# Patient Record
Sex: Female | Born: 1960 | Race: White | Hispanic: No | Marital: Married | Smoking: Never smoker
Health system: Southern US, Community
[De-identification: ages and names within clinical notes are randomized; demographics above are authoritative.]

## PROBLEM LIST (undated history)

## (undated) DIAGNOSIS — E079 Disorder of thyroid, unspecified: Secondary | ICD-10-CM

## (undated) DIAGNOSIS — K429 Umbilical hernia without obstruction or gangrene: Secondary | ICD-10-CM

## (undated) DIAGNOSIS — R32 Unspecified urinary incontinence: Secondary | ICD-10-CM

## (undated) HISTORY — DX: Disorder of thyroid, unspecified: E07.9

## (undated) HISTORY — DX: Umbilical hernia without obstruction or gangrene: K42.9

## (undated) HISTORY — DX: Unspecified urinary incontinence: R32

## (undated) HISTORY — PX: ABDOMINAL HYSTERECTOMY: SHX81

---

## 2017-03-02 ENCOUNTER — Ambulatory Visit (INDEPENDENT_AMBULATORY_CARE_PROVIDER_SITE_OTHER): Payer: No Typology Code available for payment source | Admitting: Obstetrics & Gynecology

## 2017-03-02 ENCOUNTER — Encounter: Payer: Self-pay | Admitting: Obstetrics & Gynecology

## 2017-03-02 ENCOUNTER — Other Ambulatory Visit (HOSPITAL_COMMUNITY)
Admission: RE | Admit: 2017-03-02 | Discharge: 2017-03-02 | Disposition: A | Discharge status: Home / Self Care | Payer: No Typology Code available for payment source | Source: Ambulatory Visit | Attending: Obstetrics & Gynecology | Admitting: Obstetrics & Gynecology

## 2017-03-02 VITALS — BP 116/80 | HR 86 | Resp 16 | Ht 63.5 in | Wt 152.0 lb

## 2017-03-02 DIAGNOSIS — N813 Complete uterovaginal prolapse: Secondary | ICD-10-CM | POA: Diagnosis not present

## 2017-03-02 DIAGNOSIS — Z01419 Encounter for gynecological examination (general) (routine) without abnormal findings: Secondary | ICD-10-CM

## 2017-03-02 DIAGNOSIS — Z124 Encounter for screening for malignant neoplasm of cervix: Secondary | ICD-10-CM | POA: Insufficient documentation

## 2017-03-02 NOTE — Patient Instructions (Addendum)
The new shingles vaccine is called Shingrix.  Let me know if you decide you want to get this vaccine.    When you have your next blood work.done, make sure you have a Hepatitis C Antibody drawn.

## 2017-03-02 NOTE — Progress Notes (Addendum)
56 y.o. G3P3 Married Caucasian F here for annual exam/new patient appointment.  She has moved from [place].  Moved due to job for husband.  Has been menopausal about four years.  Seeing Dr. Sharol Roussel.  Had lab work done within the last year.  On armour thyroid.  Has history of prolapse.  Feels this has worsened in the past couple of years.  Pessary recommended in the past but has not used one at this point.  Denies vaginal bleeding.  Never on HRT.  Patient's last menstrual period was 01/21/2013.          Sexually active: Yes.    The current method of family planning is post menopausal status.    Exercising: No.  The patient does not participate in regular exercise at present. Smoker:  no  Health Maintenance: Pap:  04/2014 at [facility]  History of abnormal Pap:  no MMG:  04/2014 at [facility]  Colonoscopy:  06/2014 normal per patient (obtained records--had hyperplastic polyp.  07/13/14.  Dr. Watt Climes) BMD:   never TDaP:  >10 years  Pneumonia vaccine(s):  Never Zostavax:   never Hep C testing: never Screening Labs: done recently, Hb today: same   reports that she has never smoked. She has never used smokeless tobacco. She reports that she drinks alcohol. She reports that she does not use drugs.  Past Medical History:  Diagnosis Date  . Thyroid disease   . Urinary incontinence     History reviewed. No pertinent surgical history.  Current Outpatient Prescriptions  Medication Sig Dispense Refill  . amoxicillin (AMOXIL) 875 MG tablet TK 1 T PO Q 12 H FOR 10 DAYS  0  . ARMOUR THYROID PO Take 125 mg by mouth daily.    . Cholecalciferol (VITAMIN D3 PO) Take 10,000 Int'l Units by mouth 3 (three) times a week.     No current facility-administered medications for this visit.     Family History  Problem Relation Age of Onset  . Hypertension Mother   . Hypertension Father   . Heart disease Father   . Dementia Maternal Grandmother     ROS:  Pertinent items are noted in HPI.   Otherwise, a comprehensive ROS was negative.  Exam:   BP 116/80 (BP Location: Right Arm, Patient Position: Sitting, Cuff Size: Normal)   Pulse 86   Resp 16   Ht 5' 3.5" (1.613 m)   Wt 152 lb (68.9 kg)   LMP 01/21/2013   BMI 26.50 kg/m     Height: 5' 3.5" (161.3 cm)  Ht Readings from Last 3 Encounters:  03/02/17 5' 3.5" (1.613 m)    General appearance: alert, cooperative and appears stated age Head: Normocephalic, without obvious abnormality, atraumatic Neck: no adenopathy, supple, symmetrical, trachea midline and thyroid normal to inspection and palpation Lungs: clear to auscultation bilaterally Breasts: normal appearance, no masses or tenderness Heart: regular rate and rhythm Abdomen: soft, non-tender; bowel sounds normal; no masses,  no organomegaly Extremities: extremities normal, atraumatic, no cyanosis or edema Skin: Skin color, texture, turgor normal. No rashes or lesions Lymph nodes: Cervical, supraclavicular, and axillary nodes normal. No abnormal inguinal nodes palpated Neurologic: Grossly normal   Pelvic: External genitalia:  no lesions              Urethra:  normal appearing urethra with no masses, tenderness or lesions              Bartholins and Skenes: normal  Vagina: complete uterine procidentia noted with dryness of tissue to being chronically outside the body              Cervix: no lesions and but tissue is thickened and rough in appearance              Pap taken: Yes.   Bimanual Exam:  Uterus:  normal size, contour, position, consistency, mobility, non-tender              Adnexa: normal adnexa and no mass, fullness, tenderness               Rectovaginal: Confirms               Anus:  normal sphincter tone, no lesions  Chaperone was present for exam.  A:  Well Woman with normal exam Complete uterine procidentia Hypothyroidism Urinary incontinence  P:   Mammogram guidelines discussed.  Information for local mammograms given to pt  today pap smear and HR HPV obtained today D/w pt she really needs to consider having the procidentia managed as she is only 82 and has a significant amount of time of her life to live with this significant prolapse.  Pt in agreement so referral to Dr. Quincy Simmonds made for consideration of sacrocolpopexy.  May be able to do this robotically.  Pt aware may need urodynamics but will defer to Dr. Quincy Simmonds for this recommendation. Hep C antibody testing recommended.  Pt declined today and states she will do this with next lab work with Dr. Sharol Roussel. return annually or prn

## 2017-03-04 NOTE — Progress Notes (Signed)
GYNECOLOGY  VISIT   HPI: 56 y.o.   Married  Caucasian  female   G3P3 with Patient's last menstrual period was 01/21/2013.   here for surgery consult per Dr.Miller. History of urinary incontinence and complete uterine procidentia.   Prolapse for 4 years.  Started mild but is progressive for the last year.   Having urinary urgency and some leakage of urine.  Leakage with a cough or sneeze. Walking increases urgency.  No leakage for no reason at all.   Voiding every 15 minutes upon waking.  Then during the day, voids every 2 hours.  NF - once a night.  No enuresis.   Voiding well.  Drinks coffee in am. Tea 3 times per week. Wine here and there, not every night.  Rare constipation.  No regular stool softeners.  No splinting to have BMs.  No fecal incontinence.   Not as sexually active due to the prolapse.   3 vaginal deliveries.  Largest 8 pounds 9 ounces.  No operative delivery.  Post partum hemorrhage with first delivery.   No transfusion.  GYNECOLOGIC HISTORY: Patient's last menstrual period was 01/21/2013. Contraception:  Postmenopausal Menopausal hormone therapy:  Postmenopausal Last mammogram: 04/2014 at [facility] Last pap smear:03-02-17 WNL NEG HR HPV         OB History    Gravida Para Term Preterm AB Living   3 3           SAB TAB Ectopic Multiple Live Births                     Patient Active Problem List   Diagnosis Date Noted  . Uterine procidentia 03/05/2017    Past Medical History:  Diagnosis Date  . Thyroid disease   . Urinary incontinence     No past surgical history on file.  Current Outpatient Prescriptions  Medication Sig Dispense Refill  . amoxicillin (AMOXIL) 875 MG tablet TK 1 T PO Q 12 H FOR 10 DAYS  0  . ARMOUR THYROID PO Take 125 mg by mouth daily.    . Cholecalciferol (VITAMIN D3 PO) Take 10,000 Int'l Units by mouth 3 (three) times a week.     No current facility-administered medications for this visit.       ALLERGIES: Codeine  Family History  Problem Relation Age of Onset  . Hypertension Mother   . Hypertension Father   . Heart disease Father   . Dementia Maternal Grandmother     Social History   Social History  . Marital status: Married    Spouse name: N/A  . Number of children: N/A  . Years of education: N/A   Occupational History  . Not on file.   Social History Main Topics  . Smoking status: Never Smoker  . Smokeless tobacco: Never Used  . Alcohol use Yes     Comment: occasionally   . Drug use: No  . Sexual activity: Yes    Birth control/ protection: Post-menopausal   Other Topics Concern  . Not on file   Social History Narrative  . No narrative on file    ROS:  Pertinent items are noted in HPI.  PHYSICAL EXAMINATION:    BP 104/74 (BP Location: Right Arm, Patient Position: Sitting, Cuff Size: Normal)   Pulse 60 Comment: irregular  Resp 16   Ht 5' 3.5" (1.613 m)   Wt 153 lb 9.6 oz (69.7 kg)   LMP 01/21/2013   BMI 26.78 kg/m  General appearance: alert, cooperative and appears stated age   Abdomen:  Soft, nontender, 3 cm umbilical hernia noted, no organomegaly.   Pelvic: External genitalia:  no lesions              Urethra:  normal appearing urethra with no masses, tenderness or lesions              Bartholins and Skenes: normal                 Vagina: normal appearing vagina with normal color and discharge, no lesions.  Procidentia noted.              Cervix: no lesions                Bimanual Exam:  Uterus:  normal size, contour, position, consistency, mobility, non-tender              Adnexa: no mass, fullness, tenderness              Rectal exam: Yes.   Confirms.              Anus:  normal sphincter tone, no lesions  Chaperone was present for exam.  ASSESSMENT  Uterine procidentia.  Urinary urgency and frequency.  Stress incontinence.  Umbilical hernia.   Overdue for mammogram.   PLAN  I have had a comprehensive discussion with the  patient regarding prolapse and urinary incontinence and a discussion of pelvic anatomy using a 3D model.    I have provided reading materials from [employer] regarding prolapse and incontinence in general as well as medical and surgical treatment for these conditions.   We talked about observational management also.  Medical treatments may include physical therapy, pessary use.  We discussed surgical care options for hysterectomy combined with a sacrocolpopexy (abdominally or robotically), anterior and posterior colporrhaphy, TVT Exact midurethral sling and cystoscopy.  We discussed benefits and risks of surgery which include but are not limited to bleeding, infection, damage to surrounding organs, ureteral damage, permanent mesh use which may cause erosion and exposure in the vagina, urethra, bladder or ureters, slower voiding and urinary retention, overactive bladder symptoms, reoperation, recurrence of prolapse and incontinence,  DVT, PE, death, and reaction to anesthesia.    I have discussed surgical expectations regarding the procedures and success rates, outcomes, and recovery.       I did add a discussion about her umbilical hernia and potential repair with a general surgeon concurrently at the time of a prolapse repair.   I would proceed with urodynamic testing with reduction of the prolapse prior to doing surgery.   She wants to consider her options at this time and may return for further discussion.   An After Visit Summary was printed and given to the patient.  __40____ minutes face to face time of which over 50% was spent in counseling.

## 2017-03-05 DIAGNOSIS — N813 Complete uterovaginal prolapse: Secondary | ICD-10-CM | POA: Insufficient documentation

## 2017-03-05 LAB — CYTOLOGY - PAP
Diagnosis: NEGATIVE
HPV (WINDOPATH): NOT DETECTED

## 2017-03-09 ENCOUNTER — Encounter: Payer: Self-pay | Admitting: Obstetrics and Gynecology

## 2017-03-09 ENCOUNTER — Ambulatory Visit (INDEPENDENT_AMBULATORY_CARE_PROVIDER_SITE_OTHER): Payer: No Typology Code available for payment source | Admitting: Obstetrics and Gynecology

## 2017-03-09 VITALS — BP 104/74 | HR 60 | Resp 16 | Ht 63.5 in | Wt 153.6 lb

## 2017-03-09 DIAGNOSIS — N813 Complete uterovaginal prolapse: Secondary | ICD-10-CM

## 2017-03-09 DIAGNOSIS — R32 Unspecified urinary incontinence: Secondary | ICD-10-CM | POA: Diagnosis not present

## 2017-06-25 ENCOUNTER — Telehealth: Payer: Self-pay | Admitting: Obstetrics and Gynecology

## 2017-06-25 NOTE — Telephone Encounter (Signed)
Spoke with patient and she was evaluated by Dr.Silva for urinary incontinence and uterine procidentia. Patient to probably schedule surgery end of May of June. She wants to know if possible to be fitted for a pessary in the meantime to help with symptoms? Will discuss with Dr.Miller since Alba out of office on FMLA. Routed to [place]

## 2017-06-25 NOTE — Telephone Encounter (Signed)
Patient would like to speak to a nurse about a possible pessary.

## 2017-06-26 NOTE — Telephone Encounter (Signed)
She would probably have a lot of symptom improvement.  Ok to schedule for pessary fitting.  Thanks.

## 2017-06-29 NOTE — Telephone Encounter (Signed)
Called patient and lmovm to return my call.

## 2017-07-08 NOTE — Telephone Encounter (Signed)
Left message to call Cris Gibby at [phone]. 

## 2017-07-13 NOTE — Telephone Encounter (Signed)
Spoke with patient. Appointment scheduled for 07/16/2017 at 10:00 am with Dr.Miller. Patient is agreeable to date and time.  Will close encounter.

## 2017-07-16 ENCOUNTER — Ambulatory Visit: Payer: No Typology Code available for payment source | Admitting: Obstetrics & Gynecology

## 2017-07-16 ENCOUNTER — Encounter: Payer: Self-pay | Admitting: Obstetrics & Gynecology

## 2017-07-16 VITALS — BP 94/70 | HR 68 | Resp 16 | Wt 154.0 lb

## 2017-07-16 DIAGNOSIS — N813 Complete uterovaginal prolapse: Secondary | ICD-10-CM | POA: Diagnosis not present

## 2017-07-16 NOTE — Progress Notes (Signed)
GYNECOLOGY  VISIT  CC:   Possible pessary use  HPI: 57 y.o. G3P3 Married Caucasian female here to discuss possible pessary use.  Pt has significant uterine prolapse and is planning surgery in May.  Has been having more pelvic discomfort with prolapse.  Feels, at times, there is discomfort with certain movements.  As well, feels sensation of pressure.  Does have to work to make sure bladder feels empty.  Would like to try something before surgery.  Pessary use and pelvic PT discussed.  She would like to consider a pessary.  Denies vaginal bleeding or discharge.  GYNECOLOGIC HISTORY: Patient's last menstrual period was 01/21/2013. Contraception: post menopausal  Menopausal hormone therapy: none  Patient Active Problem List   Diagnosis Date Noted  . Uterine procidentia 03/05/2017    Past Medical History:  Diagnosis Date  . Thyroid disease   . Urinary incontinence     History reviewed. No pertinent surgical history.  MEDS:   Current Outpatient Medications on File Prior to Visit  Medication Sig Dispense Refill  . ARMOUR THYROID PO Take 125 mg by mouth daily.    . Cholecalciferol (VITAMIN D3 PO) Take 10,000 Int'l Units by mouth 3 (three) times a week.     No current facility-administered medications on file prior to visit.     ALLERGIES: Codeine  Family History  Problem Relation Age of Onset  . Hypertension Mother   . Hypertension Father   . Heart disease Father   . Dementia Maternal Grandmother     SH:  Married, non smoker  Review of Systems  All other systems reviewed and are negative.   PHYSICAL EXAMINATION:    BP 94/70 (BP Location: Left Arm, Patient Position: Sitting, Cuff Size: Large)   Pulse 68   Resp 16   Wt 154 lb (69.9 kg)   LMP 01/21/2013   BMI 26.85 kg/m     General appearance: alert, cooperative and appears stated age Abdomen: soft, non-tender; bowel sounds normal; no masses,  no organomegaly  Pelvic: External genitalia:  no lesions  Urethra:  normal appearing urethra with no masses, tenderness or lesions              Bartholins and Skenes: normal                 Vagina: normal appearing vagina with normal color and discharge, no lesions, uterine procidentia noted with cervix prolapsing through vagina              Cervix: no lesions              Bimanual Exam:  Uterus:  normal size, contour, position, consistency, mobility, non-tender              Adnexa: no mass, fullness, tenderness   Pessary fitting:  #5 incontinence ring with support placed.  Pt could valsalva out this pessary.  #6 incontinence ring with support placed.  Able to ambulated and void with this in.  I did remove this and then place a #7 but pt was immediately uncomfortable with this pessary to it was removed and Milex pessary was placed without difficulty.  Pt tolerated procedure well.  Chaperone was present for exam.  Assessment: Uterine procidentia  Plan: #6 incontinence ring placed today.  If comes out, will need to plan on ordering a more still #6 Pessary.  removal and cleaning reviewed.  Precautions for returning reviewed.  Will need to recheck 3 months if does well with this.   In  addition to pessary fitting, ~15 minutes spent with patient >50% of time was in face to face discussion of above.

## 2017-07-17 ENCOUNTER — Telehealth: Payer: Self-pay | Admitting: Obstetrics & Gynecology

## 2017-07-17 NOTE — Telephone Encounter (Signed)
Patient would like to speak with nurse about her appointment yesterday.

## 2017-07-17 NOTE — Telephone Encounter (Signed)
I will order the #6 that is more stiff and will see if this works for her.  Will call when it comes into the office and have her come back.

## 2017-07-17 NOTE — Telephone Encounter (Signed)
Spoke with patient, advised as seen below per Dr. Sabra Heck.   Patient states she would like to continue to try the current pessary before ordering another. Patient will call with update next week. Advised patient new pessary will not be ordered until updated. Patient verbalizes understanding and is agreeable.   Routing to provider for final review. Patient is agreeable to disposition. Will close encounter.  Cc: Lamont Snowball, RN

## 2017-07-17 NOTE — Telephone Encounter (Signed)
Spoke with patient. Patient calling to provide pessary update for Dr. Sabra Heck. Reports #6 pessary placed 1/24, "popped out" a couple of times since yesterday, did not replace the last time it came out. Tried #7 while in office,  too big.   Denies any other symptoms.   Advised patient will update Dr. Sabra Heck and return call with recommendations, patient is agreeable.   Dr. Sabra Heck -please review and advise?

## 2017-08-27 ENCOUNTER — Other Ambulatory Visit: Payer: Self-pay

## 2017-08-27 DIAGNOSIS — Z1231 Encounter for screening mammogram for malignant neoplasm of breast: Secondary | ICD-10-CM

## 2017-09-18 ENCOUNTER — Telehealth: Payer: Self-pay | Admitting: Obstetrics and Gynecology

## 2017-09-18 NOTE — Telephone Encounter (Signed)
Patient stated that she would like to speak with someone about her next steps in possibly scheduling surgery that was discussed at her consult on 03/09/17.

## 2017-09-18 NOTE — Telephone Encounter (Signed)
Routing to [employer] for review and advise.

## 2017-09-18 NOTE — Telephone Encounter (Signed)
Return call to patient.  States pessary not working  States she does not have significant issue with urinary incontinence.  Desires to proceed with scheduling surgery in early June.  Declines proceeding with general surgery for combined hernia repair. Last saw Dr Quincy Simmonds [phone]. Surgery options discussed.  Last saw Dr Sabra Heck 06-2017. Pessary fitting.   Aware needs urodynamic testing and agreeable.  Advised will review with Dr Sabra Heck and Dr Quincy Simmonds for next step and call her back.

## 2017-09-19 NOTE — Telephone Encounter (Signed)
I agree with proceeding with urodynamic testing with reduction of the prolapse using a her pessary.  Her surgery can be performed with combined robotic and vaginal approach.  It would be wise to reassess her umbilical hernia in the office in preparation for surgery in order to advise her appropriately.  I would envision a potential robotic hysterectomy +/- BSO by Dr. Sabra Heck and then a robotic sacrocolpopexy with anterior and posterior colporrhaphy with possible TVT/cyst under my direction.   Cc- Dr. Sabra Heck

## 2017-10-02 ENCOUNTER — Encounter: Payer: Self-pay | Admitting: Obstetrics and Gynecology

## 2017-10-02 NOTE — Telephone Encounter (Signed)
Patient called on 09/25/17 and message was left on voicemail for patient to return call to Seth Bake to review benefits.   Patient returned call today and was given benefit information. Patient declines to proceed until she obtains further information from her plan. See account notes.   Routing to provider for final review. Patient agreeable to disposition. Will close encounter.

## 2017-10-14 ENCOUNTER — Telehealth: Payer: Self-pay | Admitting: Obstetrics & Gynecology

## 2017-10-14 NOTE — Telephone Encounter (Signed)
Patient called asking to talk with Seth Bake. Patient did not give any details. I informed patient that Seth Bake is not in the office today.

## 2017-10-15 NOTE — Telephone Encounter (Signed)
Returned call to patient. LMOM for patient to call back.   Patient returned call and states she is going to obtain more information from her plan. She will call me back if she decides to proceed with scheduling.    See account notes.  Routing to provider for final review. Patient agreeable to disposition. Will close encounter.

## 2018-11-10 ENCOUNTER — Ambulatory Visit: Payer: Self-pay | Admitting: Surgery

## 2019-01-24 ENCOUNTER — Other Ambulatory Visit: Payer: Self-pay | Admitting: Obstetrics & Gynecology

## 2019-01-24 DIAGNOSIS — Z1231 Encounter for screening mammogram for malignant neoplasm of breast: Secondary | ICD-10-CM

## 2019-02-14 ENCOUNTER — Other Ambulatory Visit: Payer: Self-pay

## 2019-02-14 DIAGNOSIS — Z20822 Contact with and (suspected) exposure to covid-19: Secondary | ICD-10-CM

## 2019-02-15 LAB — NOVEL CORONAVIRUS, NAA: SARS-CoV-2, NAA: NOT DETECTED

## 2019-03-09 ENCOUNTER — Ambulatory Visit
Admission: RE | Admit: 2019-03-09 | Discharge: 2019-03-09 | Disposition: A | Discharge status: Home / Self Care | Payer: BC Managed Care – PPO | Source: Ambulatory Visit | Attending: Obstetrics & Gynecology | Admitting: Obstetrics & Gynecology

## 2019-03-09 ENCOUNTER — Other Ambulatory Visit: Payer: Self-pay

## 2019-03-09 DIAGNOSIS — Z1231 Encounter for screening mammogram for malignant neoplasm of breast: Secondary | ICD-10-CM

## 2020-01-18 HISTORY — PX: CYSTOCELE REPAIR: SHX163

## 2020-04-23 IMAGING — MG MM DIGITAL SCREENING BILAT W/ TOMO AND CAD
8 series · 8 of 24 positions shown · non-contrast
Comparison: None.
COMPARISON: None.

Addendum:
CLINICAL DATA: Screening.

EXAM:
DIGITAL SCREENING BILATERAL MAMMOGRAM WITH TOMO AND CAD

[R MLO synth-2D]
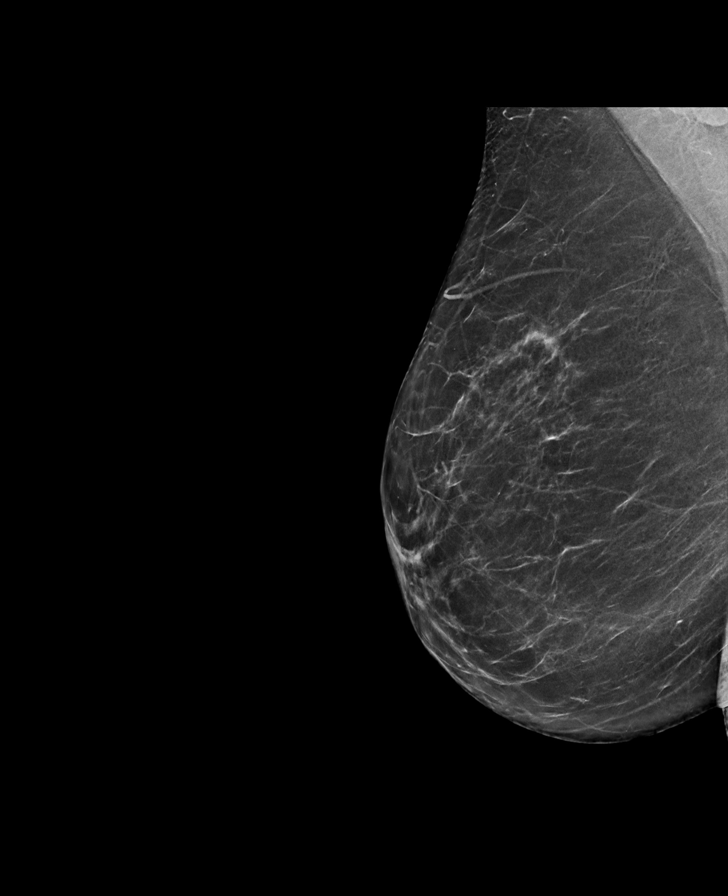

[R CC synth-2D]
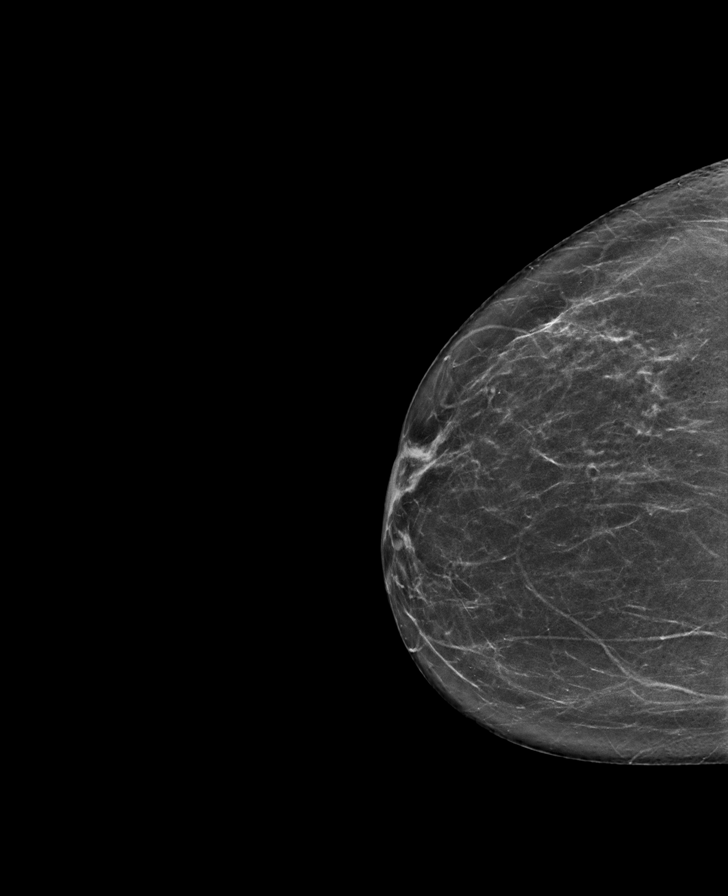

[L CC synth-2D]
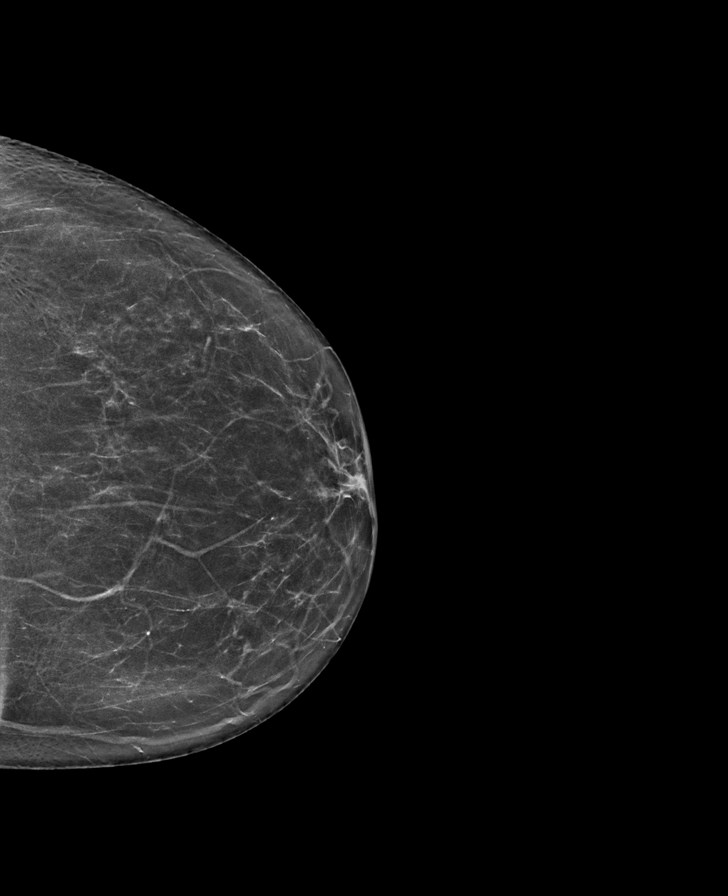

[L MLO synth-2D]
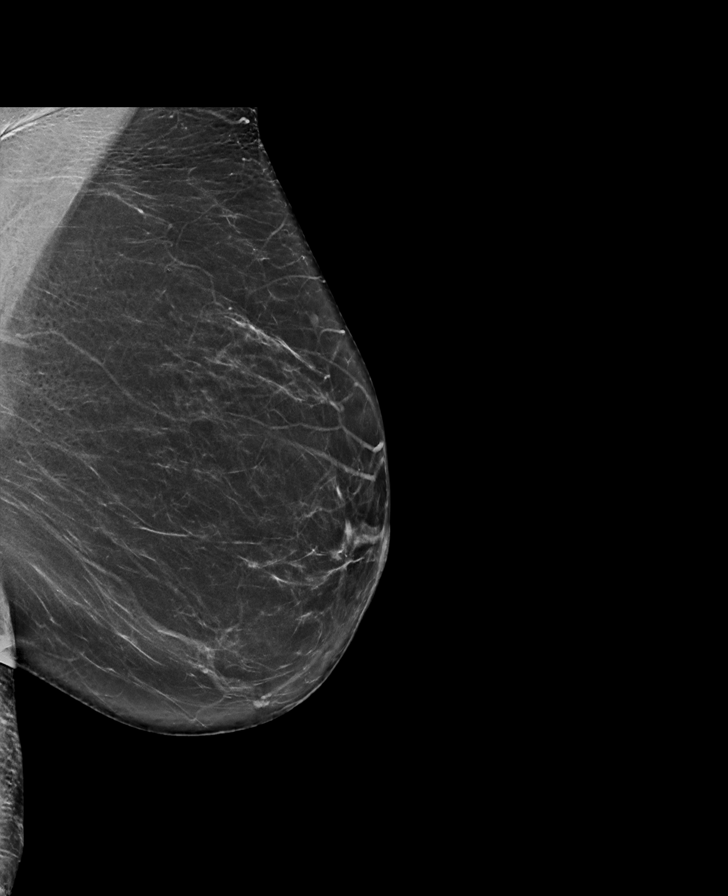

[L MLO tomo · tomo slice 39/76.0]
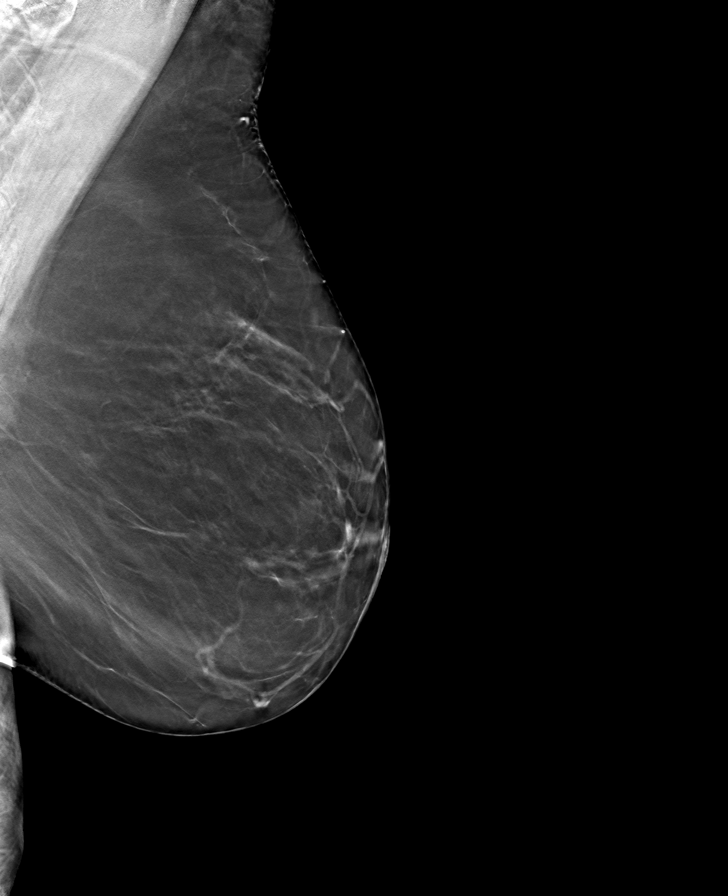

[R MLO tomo · tomo slice 37/74.0]
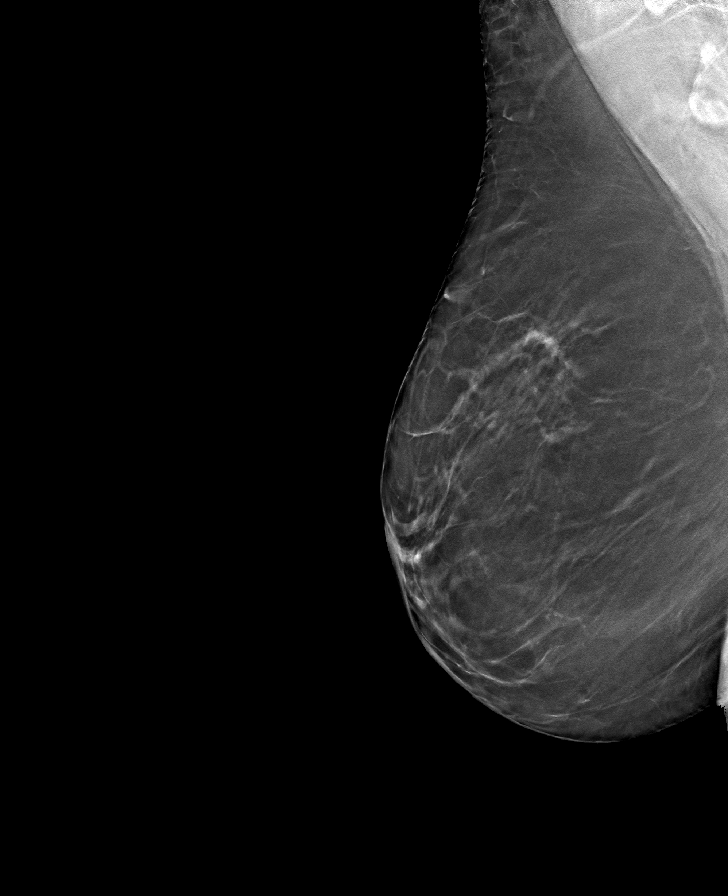

[L CC tomo · tomo slice 33/64.0]
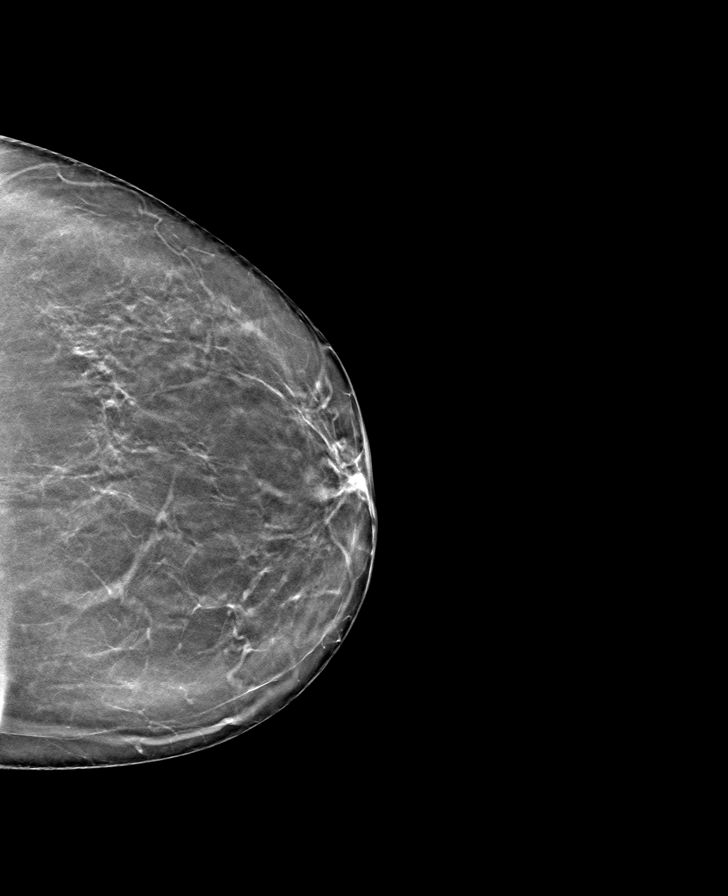

[R CC tomo · tomo slice 33/66.0]
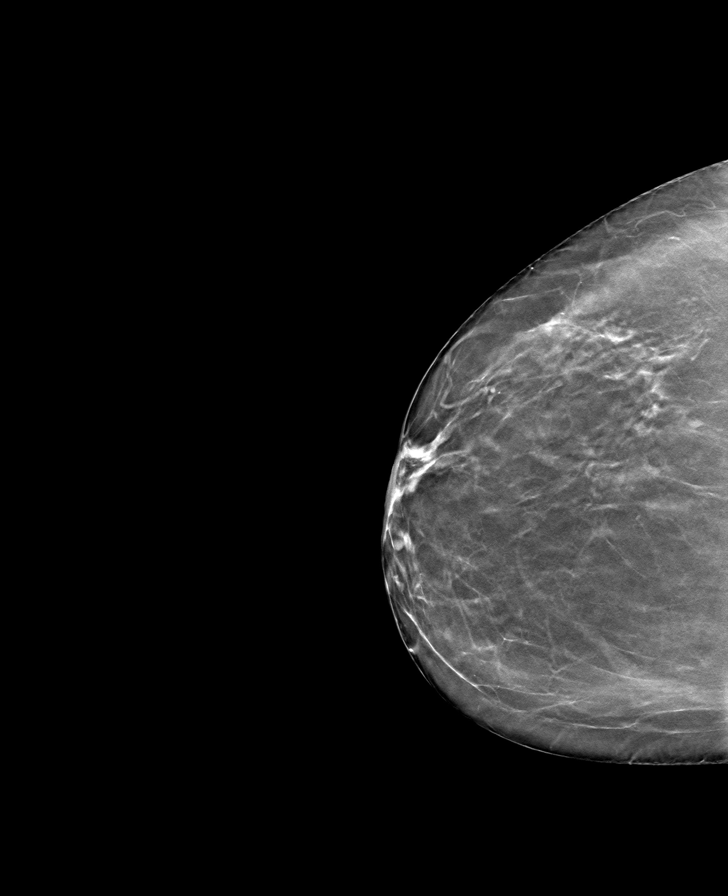

[8 of 24 positions shown; findings below may reference images not displayed]

ACR Breast Density Category b: There are scattered areas of
fibroglandular density.
FINDINGS: There are no findings suspicious for malignancy. Images were
processed with CAD.
IMPRESSION: No mammographic evidence of malignancy. A result letter of this
screening mammogram will be mailed directly to the patient.

RECOMMENDATION:
Screening mammogram in one year. (Code:C5-3-XWZ)

BI-RADS CATEGORY  1: Negative.

ADDENDUM:
Prior mammogram from May 2013 is now available demonstrating no
new abnormality in the bilateral breasts.

Recommendation: Screening mammogram in 1 year.

BI-RADS category: 1-negative

*** End of Addendum ***
ACR Breast Density Category b: There are scattered areas of
fibroglandular density.
FINDINGS: There are no findings suspicious for malignancy. Images were
processed with CAD.
IMPRESSION: No mammographic evidence of malignancy. A result letter of this
screening mammogram will be mailed directly to the patient.

RECOMMENDATION:
Screening mammogram in one year. (Code:C5-3-XWZ)

BI-RADS CATEGORY  1: Negative.

## 2022-03-20 ENCOUNTER — Other Ambulatory Visit: Payer: Self-pay

## 2022-03-20 ENCOUNTER — Inpatient Hospital Stay (HOSPITAL_COMMUNITY)
Admission: EM | Admit: 2022-03-20 | Discharge: 2022-03-24 | DRG: 392 | Disposition: A | Discharge status: Home / Self Care | Payer: 59 | Attending: Internal Medicine | Admitting: Internal Medicine

## 2022-03-20 ENCOUNTER — Encounter (HOSPITAL_COMMUNITY): Payer: Self-pay

## 2022-03-20 DIAGNOSIS — E538 Deficiency of other specified B group vitamins: Secondary | ICD-10-CM | POA: Diagnosis present

## 2022-03-20 DIAGNOSIS — K222 Esophageal obstruction: Secondary | ICD-10-CM | POA: Diagnosis present

## 2022-03-20 DIAGNOSIS — D649 Anemia, unspecified: Principal | ICD-10-CM

## 2022-03-20 DIAGNOSIS — Z8249 Family history of ischemic heart disease and other diseases of the circulatory system: Secondary | ICD-10-CM

## 2022-03-20 DIAGNOSIS — K449 Diaphragmatic hernia without obstruction or gangrene: Principal | ICD-10-CM | POA: Diagnosis present

## 2022-03-20 DIAGNOSIS — Z8719 Personal history of other diseases of the digestive system: Secondary | ICD-10-CM

## 2022-03-20 DIAGNOSIS — K648 Other hemorrhoids: Secondary | ICD-10-CM | POA: Diagnosis present

## 2022-03-20 DIAGNOSIS — E039 Hypothyroidism, unspecified: Secondary | ICD-10-CM | POA: Diagnosis present

## 2022-03-20 DIAGNOSIS — K257 Chronic gastric ulcer without hemorrhage or perforation: Secondary | ICD-10-CM | POA: Diagnosis present

## 2022-03-20 DIAGNOSIS — Z79899 Other long term (current) drug therapy: Secondary | ICD-10-CM | POA: Diagnosis not present

## 2022-03-20 DIAGNOSIS — K259 Gastric ulcer, unspecified as acute or chronic, without hemorrhage or perforation: Secondary | ICD-10-CM | POA: Diagnosis present

## 2022-03-20 DIAGNOSIS — Z885 Allergy status to narcotic agent status: Secondary | ICD-10-CM

## 2022-03-20 DIAGNOSIS — K644 Residual hemorrhoidal skin tags: Secondary | ICD-10-CM | POA: Diagnosis present

## 2022-03-20 DIAGNOSIS — K319 Disease of stomach and duodenum, unspecified: Secondary | ICD-10-CM | POA: Diagnosis present

## 2022-03-20 DIAGNOSIS — Z90710 Acquired absence of both cervix and uterus: Secondary | ICD-10-CM

## 2022-03-20 DIAGNOSIS — D500 Iron deficiency anemia secondary to blood loss (chronic): Secondary | ICD-10-CM | POA: Diagnosis present

## 2022-03-20 DIAGNOSIS — D128 Benign neoplasm of rectum: Secondary | ICD-10-CM | POA: Diagnosis present

## 2022-03-20 DIAGNOSIS — K3189 Other diseases of stomach and duodenum: Secondary | ICD-10-CM | POA: Diagnosis not present

## 2022-03-20 DIAGNOSIS — K5730 Diverticulosis of large intestine without perforation or abscess without bleeding: Secondary | ICD-10-CM | POA: Diagnosis present

## 2022-03-20 DIAGNOSIS — K621 Rectal polyp: Secondary | ICD-10-CM | POA: Diagnosis not present

## 2022-03-20 LAB — CBC WITH DIFFERENTIAL/PLATELET
Abs Immature Granulocytes: 0.03 10*3/uL (ref 0.00–0.07)
Basophils Absolute: 0 10*3/uL (ref 0.0–0.1)
Basophils Relative: 0 %
Eosinophils Absolute: 0 10*3/uL (ref 0.0–0.5)
Eosinophils Relative: 0 %
HCT: 10.4 % — ABNORMAL LOW (ref 36.0–46.0)
Hemoglobin: 2.5 g/dL — CL (ref 12.0–15.0)
Immature Granulocytes: 1 %
Lymphocytes Relative: 18 %
Lymphs Abs: 1.1 10*3/uL (ref 0.7–4.0)
MCH: 17 pg — ABNORMAL LOW (ref 26.0–34.0)
MCHC: 24 g/dL — ABNORMAL LOW (ref 30.0–36.0)
MCV: 70.7 fL — ABNORMAL LOW (ref 80.0–100.0)
Monocytes Absolute: 0.6 10*3/uL (ref 0.1–1.0)
Monocytes Relative: 10 %
Neutro Abs: 4.1 10*3/uL (ref 1.7–7.7)
Neutrophils Relative %: 71 %
Platelets: 493 10*3/uL — ABNORMAL HIGH (ref 150–400)
RBC: 1.47 MIL/uL — ABNORMAL LOW (ref 3.87–5.11)
RDW: 19.2 % — ABNORMAL HIGH (ref 11.5–15.5)
WBC: 5.8 10*3/uL (ref 4.0–10.5)
nRBC: 0.7 % — ABNORMAL HIGH (ref 0.0–0.2)

## 2022-03-20 LAB — ABO/RH: ABO/RH(D): A POS

## 2022-03-20 LAB — RETICULOCYTES
Immature Retic Fract: 46.7 % — ABNORMAL HIGH (ref 2.3–15.9)
RBC.: 1.53 MIL/uL — ABNORMAL LOW (ref 3.87–5.11)
Retic Count, Absolute: 109.9 10*3/uL (ref 19.0–186.0)
Retic Ct Pct: 7.2 % — ABNORMAL HIGH (ref 0.4–3.1)

## 2022-03-20 LAB — PROTIME-INR
INR: 1.1 (ref 0.8–1.2)
Prothrombin Time: 14.1 seconds (ref 11.4–15.2)

## 2022-03-20 LAB — COMPREHENSIVE METABOLIC PANEL
ALT: 12 U/L (ref 0–44)
AST: 17 U/L (ref 15–41)
Albumin: 3.3 g/dL — ABNORMAL LOW (ref 3.5–5.0)
Alkaline Phosphatase: 54 U/L (ref 38–126)
Anion gap: 7 (ref 5–15)
BUN: 20 mg/dL (ref 8–23)
CO2: 21 mmol/L — ABNORMAL LOW (ref 22–32)
Calcium: 8.5 mg/dL — ABNORMAL LOW (ref 8.9–10.3)
Chloride: 106 mmol/L (ref 98–111)
Creatinine, Ser: 0.8 mg/dL (ref 0.44–1.00)
GFR, Estimated: >60 mL/min (ref >60)
Glucose, Bld: 120 mg/dL — ABNORMAL HIGH (ref 70–99)
Potassium: 3.7 mmol/L (ref 3.5–5.1)
Sodium: 134 mmol/L — ABNORMAL LOW (ref 135–145)
Total Bilirubin: 0.6 mg/dL (ref 0.3–1.2)
Total Protein: 6.3 g/dL — ABNORMAL LOW (ref 6.5–8.1)

## 2022-03-20 LAB — IRON AND TIBC
Iron: 14 ug/dL — ABNORMAL LOW (ref 28–170)
Saturation Ratios: 2 % — ABNORMAL LOW (ref 10.4–31.8)
TIBC: 593 ug/dL — ABNORMAL HIGH (ref 250–450)
UIBC: 579 ug/dL

## 2022-03-20 LAB — POC OCCULT BLOOD, ED: Fecal Occult Bld: NEGATIVE

## 2022-03-20 LAB — VITAMIN B12: Vitamin B-12: 115 pg/mL — ABNORMAL LOW (ref 180–914)

## 2022-03-20 LAB — FOLATE: Folate: 16.7 ng/mL (ref >5.9)

## 2022-03-20 LAB — MRSA NEXT GEN BY PCR, NASAL: MRSA by PCR Next Gen: NOT DETECTED

## 2022-03-20 LAB — PREPARE RBC (CROSSMATCH)

## 2022-03-20 LAB — FERRITIN: Ferritin: 5 ng/mL — ABNORMAL LOW (ref 11–307)

## 2022-03-20 LAB — LIPASE, BLOOD: Lipase: 46 U/L (ref 11–51)

## 2022-03-20 MED ORDER — ONDANSETRON HCL 4 MG/2ML IJ SOLN: 4.0000 mg | Freq: Four times a day (QID) | INTRAMUSCULAR | Status: DC | PRN

## 2022-03-20 MED ORDER — THYROID 60 MG PO TABS
120.0000 mg | ORAL_TABLET | Freq: Every day | ORAL | Status: DC
  Administered 2022-03-22 – 2022-03-23 (×2): 120 mg via ORAL
  Filled 2022-03-20: qty 2
  Filled 2022-03-20: qty 1
  Filled 2022-03-20: qty 2

## 2022-03-20 MED ORDER — CHLORHEXIDINE GLUCONATE CLOTH 2 % EX PADS
6.0000 | MEDICATED_PAD | Freq: Every day | CUTANEOUS | Status: DC
  Administered 2022-03-20 – 2022-03-24 (×2): 6 via TOPICAL

## 2022-03-20 MED ORDER — ACETAMINOPHEN 325 MG PO TABS
650.0000 mg | ORAL_TABLET | Freq: Four times a day (QID) | ORAL | Status: DC | PRN
  Administered 2022-03-21 – 2022-03-22 (×2): 650 mg via ORAL
  Filled 2022-03-20 (×2): qty 2

## 2022-03-20 MED ORDER — ORAL CARE MOUTH RINSE: 15.0000 mL | OROMUCOSAL | Status: DC | PRN

## 2022-03-20 MED ORDER — PANTOPRAZOLE 80MG IVPB - SIMPLE MED
80.0000 mg | Freq: Once | INTRAVENOUS | Status: AC
  Administered 2022-03-20: 80 mg via INTRAVENOUS
  Filled 2022-03-20: qty 80

## 2022-03-20 MED ORDER — PANTOPRAZOLE SODIUM 40 MG IV SOLR
40.0000 mg | Freq: Two times a day (BID) | INTRAVENOUS | Status: DC
  Administered 2022-03-24: 40 mg via INTRAVENOUS
  Filled 2022-03-20: qty 10

## 2022-03-20 MED ORDER — ONDANSETRON HCL 4 MG PO TABS: 4.0000 mg | ORAL_TABLET | Freq: Four times a day (QID) | ORAL | Status: DC | PRN

## 2022-03-20 MED ORDER — ACETAMINOPHEN 650 MG RE SUPP: 650.0000 mg | Freq: Four times a day (QID) | RECTAL | Status: DC | PRN

## 2022-03-20 MED ORDER — PANTOPRAZOLE INFUSION (NEW) - SIMPLE MED
8.0000 mg/h | INTRAVENOUS | Status: DC
  Administered 2022-03-20 – 2022-03-21 (×2): 8 mg/h via INTRAVENOUS
  Filled 2022-03-20 (×2): qty 80

## 2022-03-20 MED ORDER — SODIUM CHLORIDE 0.9 % IV SOLN
10.0000 mL/h | Freq: Once | INTRAVENOUS | Status: AC
  Administered 2022-03-20: 10 mL/h via INTRAVENOUS

## 2022-03-20 NOTE — H&P (Signed)
History and Physical    Patient: Patricia Olsen RCV:[id number] DOB: [birth year] DOA: 03/20/2022 DOS: the patient was seen and examined on 03/20/2022 PCP: Patient, No Pcp Per  Patient coming from: Home  Chief Complaint: Shortness of breath, fatigue Chief Complaint  Patient presents with   Abnormal Lab   HPI: Patricia Olsen is a 61 y.o. female with medical history significant of hypothyroidism who presented to [facility] ED on 9/28 with 1 week history of progressive fatigue, shortness of breath worse with exertion, episodes of dark stool.  Husband also reports that she has become more pale in complexion over this timeframe as well and patient endorses increased cravings for ice.  Patient denies any use of antiplatelets/anticoagulants but has used increased NSAIDs over the past 2 to 3 days due to a sinus headache.  Additionally patient was recently seen by her primary care physician with noted low hemoglobin and started on iron supplementation over the last few days.  Patient denies any hematemesis or gross blood in her stool.  Patient has had a colonoscopy back in 2016 with Dr. Lu Duffel got only remarkable for diverticula.  Further denies any unintentional weight loss.  In the ED, temperature 98.8 F, HR 111, RR 23, BP 112/62, SPO2 97% on room air.  WBC count 5.8, hemoglobin 2.5, platelets 493, MCV 70.7.  Sodium 134, potassium 3.7, chloride 106, CO2 21, BUN 20, creatinine 0.80.  Glucose 120.  INR 1.1.  EDP ordered 2 units.  Receives to be transfused.  TRH consulted for admission and further evaluation and management of acute symptomatic anemia concerning for possible upper GI bleed.    Review of Systems: As mentioned in the history of present illness. All other systems reviewed and are negative. Past Medical History:  Diagnosis Date   Thyroid disease    Umbilical hernia    Urinary incontinence    Past Surgical History:  Procedure Laterality Date   ABDOMINAL HYSTERECTOMY     CYSTOCELE REPAIR  01/18/2020    Social History:  reports that she has never smoked. She has never used smokeless tobacco. She reports current alcohol use. She reports that she does not use drugs.  Allergies  Allergen Reactions   Codeine Nausea And Vomiting    Family History  Problem Relation Age of Onset   Hypertension Mother    Hypertension Father    Heart disease Father    Dementia Maternal Grandmother    Breast cancer Neg Hx     Prior to Admission medications   Medication Sig Start Date End Date Taking? Authorizing Provider  ARMOUR THYROID PO Take 125 mg by mouth daily.    [provider]  Cholecalciferol (VITAMIN D3 PO) Take 10,000 Int'l Units by mouth 3 (three) times a week.    [provider]    Physical Exam: Vitals:   03/20/22 1427 03/20/22 1600  BP: 111/64 112/62  Pulse: 71 (!) 111  Resp: 18 (!) 23  Temp: 98.8 F (37.1 C)   TempSrc: Oral   SpO2: 98% 97%    GEN: 61 yo Caucasian female in NAD, alert and oriented x 3, wd/wn; pale in complexion HEENT: NCAT, PERRL, EOMI, sclera clear, pale conjunctiva, MMM PULM: CTAB w/o wheezes/crackles, normal respiratory effort, on room air with SPO2 100% at rest CV: Tachycardic, regular rhythm w/o M/G/R GI: abd soft, NTND, NABS, no R/G/M MSK: no peripheral edema, muscle strength globally intact 5/5 bilateral upper/lower extremities NEURO: CN II-XII intact, no focal deficits, sensation to light touch intact PSYCH: normal mood/affect  Integumentary: Pale skin, otherwise no other concerning rashes/lesions/wounds     Assessment and Plan:  Symptomatic anemia concerning for UGIB Patient presenting to ED with progressive fatigue, shortness of breath worse with exertion and pale complexion with associated dark stools over the last week.  Denies antiplatelet/anticoagulant use, no significant alcohol abuse but does endorse some increased use of NSAIDs during this timeframe.  Patient was found to have a hemoglobin of 2.5 with MCV of 70.7.  No  previous labs available for review in EMR or care everywhere for comparison. --Admit to stepdown unit --[facility] consulted for evaluation in the a.m. --Check anemia panel --Transfuse 2 unit PRBCs, repeat hemoglobin 2 hours posttransfusion; maintain hemoglobin greater than 7.0 --Protonix drip --Clear liquid diet, n.p.o. after midnight  Hypothyroidism --Check TSH --Continue home Armour Thyroid    Advance Care Planning: Full code  Consults: [facility], Dr. Alessandra Bevels  Family Communication: Updated patient and spouse present at bedside  Severity of Illness: The appropriate patient status for this patient is INPATIENT. Inpatient status is judged to be reasonable and necessary in order to provide the required intensity of service to ensure the patient's safety. The patient's presenting symptoms, physical exam findings, and initial radiographic and laboratory data in the context of their chronic comorbidities is felt to place them at high risk for further clinical deterioration. Furthermore, it is not anticipated that the patient will be medically stable for discharge from the hospital within 2 midnights of admission.   * I certify that at the point of admission it is my clinical judgment that the patient will require inpatient hospital care spanning beyond 2 midnights from the point of admission due to high intensity of service, high risk for further deterioration and high frequency of surveillance required.*  Author: Keinan Brouillet J [place] DO 03/20/2022 5:57 PM  For on call review [email or web address].

## 2022-03-20 NOTE — ED Triage Notes (Signed)
Pt states she had blood work taken on Monday and was told today to come to ED for critical Hgb of 2.6. Pt denies bloody emesis, hematochezia, melena. Pt not anticoagulated on blood thinners. Pt also endorsing worsening SOB, dizziness, and fatigue for several weeks.

## 2022-03-20 NOTE — ED Provider Notes (Signed)
Seen after prior EDP.  Patient with confirmed anemia.  Patient with symptoms related to her anemia.  Would benefit from transfusion and admission.  Hospitalist service made aware of case and will evaluate for admission.   Valarie Merino, MD 03/20/22 [id number]

## 2022-03-20 NOTE — ED Provider Notes (Signed)
[facility] DEPT Provider Note   CSN: [id number] Arrival date & time: 03/20/22  1411     History  Chief Complaint  Patient presents with   Abnormal Lab    Patricia Olsen is a 61 y.o. female.  61 yo F with a chief complaints of shortness of breath on exertion and being pale.  Is been going on for a couple months and is worsening.  Seen her family doctor and found to have a hemoglobin less than 3.  Was sent to the emergency department for evaluation.  She denies dark stool or blood in her stool denies abdominal pain denies night sweats weight loss.  She denies any drastic diet changes.   Abnormal Lab      Home Medications Prior to Admission medications   Medication Sig Start Date End Date Taking? Authorizing Provider  ARMOUR THYROID PO Take 125 mg by mouth daily.    [provider]  Cholecalciferol (VITAMIN D3 PO) Take 10,000 Int'l Units by mouth 3 (three) times a week.    [provider]      Allergies    Codeine    Review of Systems   Review of Systems  Physical Exam Updated Vital Signs BP 111/64 (BP Location: Right Arm)   Pulse 71   Temp 98.8 F (37.1 C) (Oral)   Resp 18   LMP 01/21/2013   SpO2 98%  Physical Exam Vitals and nursing note reviewed.  Constitutional:      General: She is not in acute distress.    Appearance: She is well-developed. She is not diaphoretic.     Comments: Pale  HENT:     Head: Normocephalic and atraumatic.  Eyes:     Pupils: Pupils are equal, round, and reactive to light.  Cardiovascular:     Rate and Rhythm: Normal rate and regular rhythm.     Heart sounds: No murmur heard.    No friction rub. No gallop.  Pulmonary:     Effort: Pulmonary effort is normal.     Breath sounds: No wheezing or rales.  Abdominal:     General: There is no distension.     Palpations: Abdomen is soft.     Tenderness: There is no abdominal tenderness.     Comments: No obvious organomegaly   Musculoskeletal:        General: No tenderness.     Cervical back: Normal range of motion and neck supple.  Skin:    General: Skin is warm and dry.  Neurological:     Mental Status: She is alert and oriented to person, place, and time.  Psychiatric:        Behavior: Behavior normal.     ED Results / Procedures / Treatments   Labs (all labs ordered are listed, but only abnormal results are displayed) Labs Reviewed  CBC WITH DIFFERENTIAL/PLATELET  COMPREHENSIVE METABOLIC PANEL  PROTIME-INR  LIPASE, BLOOD  TYPE AND SCREEN    EKG None  Radiology No results found.  Procedures Procedures    Medications Ordered in ED Medications - No data to display  ED Course/ Medical Decision Making/ A&P                           Medical Decision Making  61 yo F with a chief complaints of acute anemia.  The patient had seen her family doctor after having some shortness of breath on exertion for some time.  Found to  have a hemoglobin less than 3.  Will recheck here.  I suspect those labs are likely true based on her history.  She had high platelets, otherwise had low white count low hemoglobin on her CBC that she brought in with her.  No obvious electrolyte abnormality.  Patient care was signed out to Dr. Francia Greaves, please see his note for further details care in the ED.  CRITICAL CARE Performed by: Cecilio Asper   Total critical care time: 35 minutes  Critical care time was exclusive of separately billable procedures and treating other patients.  Critical care was necessary to treat or prevent imminent or life-threatening deterioration.  Critical care was time spent personally by me on the following activities: development of treatment plan with patient and/or surrogate as well as nursing, discussions with consultants, evaluation of patient's response to treatment, examination of patient, obtaining history from patient or surrogate, ordering and performing treatments and  interventions, ordering and review of laboratory studies, ordering and review of radiographic studies, pulse oximetry and re-evaluation of patient's condition.  The patients results and plan were reviewed and discussed.   Any x-rays performed were independently reviewed by myself.   Differential diagnosis were considered with the presenting HPI.  Medications - No data to display  Vitals:   03/20/22 1427  BP: 111/64  Pulse: 71  Resp: 18  Temp: 98.8 F (37.1 C)  TempSrc: Oral  SpO2: 98%    Final diagnoses:  Symptomatic anemia    Admission/ observation were discussed with the admitting physician, patient and/or family and they are comfortable with the plan.          Final Clinical Impression(s) / ED Diagnoses Final diagnoses:  Symptomatic anemia    Rx / DC Orders ED Discharge Orders     None         Deno Etienne, DO 03/20/22 1616

## 2022-03-21 LAB — COMPREHENSIVE METABOLIC PANEL
ALT: 12 U/L (ref 0–44)
AST: 13 U/L — ABNORMAL LOW (ref 15–41)
Albumin: 3.1 g/dL — ABNORMAL LOW (ref 3.5–5.0)
Alkaline Phosphatase: 47 U/L (ref 38–126)
Anion gap: 9 (ref 5–15)
BUN: 16 mg/dL (ref 8–23)
CO2: 20 mmol/L — ABNORMAL LOW (ref 22–32)
Calcium: 8.3 mg/dL — ABNORMAL LOW (ref 8.9–10.3)
Chloride: 108 mmol/L (ref 98–111)
Creatinine, Ser: 0.78 mg/dL (ref 0.44–1.00)
GFR, Estimated: >60 mL/min (ref >60)
Glucose, Bld: 101 mg/dL — ABNORMAL HIGH (ref 70–99)
Potassium: 3.7 mmol/L (ref 3.5–5.1)
Sodium: 137 mmol/L (ref 135–145)
Total Bilirubin: 1.7 mg/dL — ABNORMAL HIGH (ref 0.3–1.2)
Total Protein: 5.7 g/dL — ABNORMAL LOW (ref 6.5–8.1)

## 2022-03-21 LAB — CBC
HCT: 18.5 % — ABNORMAL LOW (ref 36.0–46.0)
Hemoglobin: 5.4 g/dL — CL (ref 12.0–15.0)
MCH: 22.6 pg — ABNORMAL LOW (ref 26.0–34.0)
MCHC: 29.2 g/dL — ABNORMAL LOW (ref 30.0–36.0)
MCV: 77.4 fL — ABNORMAL LOW (ref 80.0–100.0)
Platelets: 459 10*3/uL — ABNORMAL HIGH (ref 150–400)
RBC: 2.39 MIL/uL — ABNORMAL LOW (ref 3.87–5.11)
RDW: 19.4 % — ABNORMAL HIGH (ref 11.5–15.5)
WBC: 5.1 10*3/uL (ref 4.0–10.5)
nRBC: 1 % — ABNORMAL HIGH (ref 0.0–0.2)

## 2022-03-21 LAB — PREPARE RBC (CROSSMATCH)

## 2022-03-21 LAB — HEMOGLOBIN AND HEMATOCRIT, BLOOD
HCT: 28.2 % — ABNORMAL LOW (ref 36.0–46.0)
HCT: 28.3 % — ABNORMAL LOW (ref 36.0–46.0)
Hemoglobin: 8.6 g/dL — ABNORMAL LOW (ref 12.0–15.0)
Hemoglobin: 8.5 g/dL — ABNORMAL LOW (ref 12.0–15.0)

## 2022-03-21 LAB — MAGNESIUM: Magnesium: 2.1 mg/dL (ref 1.7–2.4)

## 2022-03-21 LAB — TSH: TSH: 10.251 u[IU]/mL — ABNORMAL HIGH (ref 0.350–4.500)

## 2022-03-21 MED ORDER — CYANOCOBALAMIN 1000 MCG/ML IJ SOLN
1000.0000 ug | INTRAMUSCULAR | Status: DC
  Administered 2022-03-21: 1000 ug via INTRAMUSCULAR
  Filled 2022-03-21: qty 1

## 2022-03-21 MED ORDER — SODIUM CHLORIDE 0.9% IV SOLUTION: Freq: Once | INTRAVENOUS | Status: AC

## 2022-03-21 MED ORDER — SODIUM CHLORIDE 0.9 % IV SOLN
250.0000 mg | Freq: Every day | INTRAVENOUS | Status: AC
  Administered 2022-03-21 – 2022-03-22 (×2): 250 mg via INTRAVENOUS
  Filled 2022-03-21 (×2): qty 20

## 2022-03-21 NOTE — Consult Note (Signed)
[facility] Gastroenterology Consult  Referring Provider: No ref. provider found Primary Care Physician:  Patient, No Pcp Per Primary Gastroenterologist: Dr. Clarene Essex  Reason for Consultation: anemia, dark stools  SUBJECTIVE:   HPI: Joby Hershkowitz is a 61 y.o. female with past medical history significant for hypothyroidism. She notes having lightheadedness for the past 1 month. Roughly 1 week ago, she saw a dark stool which she attributed to eating blueberries, no further episodes. She does not take anticoagulant medications. She was using NSAIDs over the past 1 week for sinus headache. No abdominal pain. No chest pain or shortness of breath. No family history colon cancer nor inflammatory bowel disease.   Last colonoscopy completed 07/13/2014 (Dr. Clarene Essex) with findings of distal sigmoid hyperplastic polyp and diverticulosis.   Past Medical History:  Diagnosis Date   Thyroid disease    Umbilical hernia    Urinary incontinence    Past Surgical History:  Procedure Laterality Date   ABDOMINAL HYSTERECTOMY     CYSTOCELE REPAIR  01/18/2020   Prior to Admission medications   Medication Sig Start Date End Date Taking? Authorizing Provider  ARMOUR THYROID 120 MG tablet Take 120 mg by mouth daily before breakfast.   Yes [provider]  NON FORMULARY Take 1,000 mg by mouth See admin instructions. Sustained-release Vitamin C 1,000 mg tablets- Take 1,000 mg by mouth one to three times a day   Yes [provider]  oxymetazoline (AFRIN) 0.05 % nasal spray Place 1 spray into both nostrils 2 (two) times daily as needed for congestion (if using for 3 consecutive days, take a 3-day's "break").   Yes [provider]  QUERCETIN PO Take 1 tablet by mouth daily.   Yes [provider]  Sodium Chloride-Xylitol (XLEAR SINUS CARE SPRAY) SOLN Place 2 sprays into both nostrils daily as needed (for congestion).   Yes [provider]  Thiamine HCl (VITAMIN B-1 PO) Take 1  tablet by mouth daily.   Yes [provider]  Vitamin D-Vitamin K (K2 PLUS D3 PO) Take 1 tablet by mouth daily.   Yes [provider]   Current Facility-Administered Medications  Medication Dose Route Frequency Provider Last Rate Last Admin   acetaminophen (TYLENOL) tablet 650 mg  650 mg Oral Q6H PRN [place] Eric J, DO       Or   acetaminophen (TYLENOL) suppository 650 mg  650 mg Rectal Q6H PRN [place] Eric J, DO       Chlorhexidine Gluconate Cloth 2 % PADS 6 each  6 each Topical Q0600 [place] Donnamarie Poag, DO   6 each at 03/20/22 2210   ferric gluconate (FERRLECIT) 250 mg in sodium chloride 0.9 % 250 mL IVPB  250 mg Intravenous Daily [place] Donnamarie Poag, DO   Stopped at 03/21/22 1008   ondansetron (ZOFRAN) tablet 4 mg  4 mg Oral Q6H PRN [place] Eric J, DO       Or   ondansetron [facility]) injection 4 mg  4 mg Intravenous Q6H PRN [place] Eric J, DO       Oral care mouth rinse  15 mL Mouth Rinse PRN [place] Eric J, DO       [START ON 03/24/2022] pantoprazole (PROTONIX) injection 40 mg  40 mg Intravenous Q12H [place] Eric J, DO       pantoprozole (PROTONIX) 80 mg /NS 100 mL infusion  8 mg/hr Intravenous Continuous [place] Eric J, DO 10 mL/hr at 03/21/22 0800 8 mg/hr at 03/21/22 0800   [START ON 03/22/2022]  thyroid (ARMOUR) tablet 120 mg  120 mg Oral QAC breakfast [place] Donnamarie Poag, DO       Allergies as of 03/20/2022 - Review Complete 03/20/2022  Allergen Reaction Noted   Codeine Nausea And Vomiting 03/09/2017   Family History  Problem Relation Age of Onset   Hypertension Mother    Hypertension Father    Heart disease Father    Dementia Maternal Grandmother    Breast cancer Neg Hx    Social History   Socioeconomic History   Marital status: Married    Spouse name: Not on file   Number of children: Not on file   Years of education: Not on file   Highest education level: Not on file  Occupational History   Not on file  Tobacco Use   Smoking status: Never   Smokeless  tobacco: Never  Vaping Use   Vaping Use: Never used  Substance and Sexual Activity   Alcohol use: Yes    Comment: occasionally    Drug use: No   Sexual activity: Yes    Birth control/protection: Post-menopausal  Other Topics Concern   Not on file  Social History Narrative   Not on file   Social Determinants of Health   Financial Resource Strain: Not on file  Food Insecurity: No Food Insecurity (03/20/2022)   Hunger Vital Sign    Worried About Running Out of Food in the Last Year: Never true    Ran Out of Food in the Last Year: Never true  Transportation Needs: No Transportation Needs (03/20/2022)   PRAPARE - Hydrologist (Medical): No    Lack of Transportation (Non-Medical): No  Physical Activity: Not on file  Stress: Not on file  Social Connections: Not on file  Intimate Partner Violence: Not At Risk (03/21/2022)   Humiliation, Afraid, Rape, and Kick questionnaire    Fear of Current or Ex-Partner: No    Emotionally Abused: No    Physically Abused: No    Sexually Abused: No   Review of Systems:  Review of Systems  Respiratory:  Negative for shortness of breath.   Cardiovascular:  Negative for chest pain.  Gastrointestinal:  Positive for melena. Negative for abdominal pain, blood in stool, constipation, diarrhea, nausea and vomiting.  Neurological:  Positive for dizziness and headaches.    OBJECTIVE:   Temp:  [98 F (36.7 C)-99 F (37.2 C)] 98.4 F (36.9 C) (09/29 1035) Pulse Rate:  [71-120] 96 (09/29 1035) Resp:  [13-32] 20 (09/29 1035) BP: (88-118)/(44-82) 113/60 (09/29 1035) SpO2:  [69 %-100 %] 95 % (09/29 1035) Weight:  [70.3 kg] 70.3 kg (09/28 1833) Last BM Date :  (PTA) Physical Exam Constitutional:      General: She is not in acute distress.    Appearance: She is not ill-appearing, toxic-appearing or diaphoretic.  Cardiovascular:     Rate and Rhythm: Normal rate and regular rhythm.  Pulmonary:     Effort: No respiratory  distress.     Breath sounds: Normal breath sounds.     Comments: No supplemental oxygen in place at time of exam. Abdominal:     General: Bowel sounds are normal. There is no distension.     Palpations: Abdomen is soft.     Tenderness: There is no abdominal tenderness. There is no guarding.  Musculoskeletal:     Right lower leg: No edema.     Left lower leg: No edema.  Skin:    General: Skin is warm and  dry.  Neurological:     Mental Status: She is alert.     Labs: Recent Labs    03/20/22 1605 03/21/22 0320  WBC 5.8 5.1  HGB 2.5* 5.4*  HCT 10.4* 18.5*  PLT 493* 459*   BMET Recent Labs    03/20/22 1605 03/21/22 0320  NA 134* 137  K 3.7 3.7  CL 106 108  CO2 21* 20*  GLUCOSE 120* 101*  BUN 20 16  CREATININE 0.80 0.78  CALCIUM 8.5* 8.3*   LFT Recent Labs    03/21/22 0320  PROT 5.7*  ALBUMIN 3.1*  AST 13*  ALT 12  ALKPHOS 47  BILITOT 1.7*   PT/INR Recent Labs    03/20/22 1605  LABPROT 14.1  INR 1.1   Diagnostic imaging: No results found.  IMPRESSION: Microcytic, symptomatic, iron deficiency anemia Possible melena History hyperplastic polyp History diverticulosis Other: hypothyroidism  PLAN: - Continue hemodynamic resuscitation, is responding well to blood transfusions currently - IV PPI Q12Hr - Ok for regular diet 03/21/22 and 03/22/22 - Discussed recommendations for EGD and colonoscopy on 03/24/22 with patient, discussed risks/benefits/alternatives to procedure, she was agreeable to proceed and provided verbal consent to me  - Clear liquid diet 03/23/22 with bowel preparation in afternoon - NPO 03/24/22 for EGD and colonoscopy - Trend H/H, transfuse for Hgb < 7 - GI will follow   LOS: 1 day   Danton Clap, [facility]

## 2022-03-21 NOTE — Progress Notes (Addendum)
PROGRESS NOTE    Patricia Olsen  CWC:[id number] DOB: [birth year] DOA: 03/20/2022 PCP: Patient, No Pcp Per    Brief Narrative:   Patricia Olsen is a 61 y.o. female with past medical history significant for hypothyroidism who presented to [facility] ED on 9/28 with 1 week history of progressive fatigue, shortness of breath worse with exertion, episodes of dark stool.  Husband also reports that she has become more pale in complexion over this timeframe as well and patient endorses increased cravings for ice.  Patient denies any use of antiplatelets/anticoagulants but has used increased NSAIDs over the past 2 to 3 days due to a sinus headache.  Additionally patient was recently seen by her primary care physician with noted low hemoglobin and started on iron supplementation over the last few days.  Patient denies any hematemesis or gross blood in her stool.  Patient has had a colonoscopy back in 2016 with Dr. Lu Duffel got only remarkable for diverticula.  Further denies any unintentional weight loss.   In the ED, temperature 98.8 F, HR 111, RR 23, BP 112/62, SPO2 97% on room air.  WBC count 5.8, hemoglobin 2.5, platelets 493, MCV 70.7.  Sodium 134, potassium 3.7, chloride 106, CO2 21, BUN 20, creatinine 0.80.  Glucose 120.  INR 1.1.  EDP ordered 2 units.  Receives to be transfused.  TRH consulted for admission and further evaluation and management of acute symptomatic anemia concerning for possible upper GI bleed.  Assessment & Plan:   Symptomatic anemia concerning for UGIB Iron deficiency anemia Patient presenting to ED with progressive fatigue, shortness of breath worse with exertion and pale complexion with associated dark stools over the last week.  Denies antiplatelet/anticoagulant use, no significant alcohol abuse but does endorse some increased use of NSAIDs during this timeframe.  Patient was found to have a hemoglobin of 2.5 with MCV of 70.7.  No previous labs available for review in EMR or care everywhere  for comparison.  Anemia panel with iron 14, TIBC elevated at 593, ferritin 5, folate 16.7, B12 115. [name] following, appreciate assistance --s/p 2u pRBC 9/28 --Hgb 2.5>5.4; transfuse additional 2 unit PRBCs today; repeat H&H 2 hours posttransfusion --IV iron x2 --maintain hemoglobin greater than 7.0 --Protonix '40mg'$  IV q12h --GI plans EGD/colonoscopy on 10/2  Vitamin B12 deficiency Vitamin B12 low at 115, possibly contributing to her underlying anemia. --B12 1000 mcg IM weekly   Hypothyroidism TSH elevated at 10.251. --Check free T3/free T4 --Continue home Armour Thyroid '120mg'$  PO daily   DVT prophylaxis: SCDs Start: 03/20/22 2151    Code Status: Full Code Family Communication: No family present at bedside this morning  Disposition Plan:  Level of care: Stepdown Status is: Inpatient Remains inpatient appropriate because: Continued need for blood transfusion, IV iron, pending EGD/colonoscopy 10/2    Consultants:  [facility] gastroenterology  Procedures:  None  Antimicrobials:  None   Subjective: Patient seen examined bedside, resting comfortably.  Lying in bed.  No specific complaints this morning.  Shortness of breath and fatigue improved.  Received 2 unit PRBCs overnight, hemoglobin now up to 5.4; 2 further units now ordered.  Awaiting GI evaluation.  Discussed with patient significant iron deficiency and will start IV iron infusions x2.  No other questions or concerns at this time.  Denies headache, no dizziness, no chest pain, no palpitations, no abdominal pain, no focal weakness, no fever/chills/night sweats, no nausea/vomiting/diarrhea, no cough/congestion, no paresthesias.  No acute events overnight per nursing staff.  Objective: Vitals:   03/21/22  0900 03/21/22 1000 03/21/22 1015 03/21/22 1035  BP: 95/62 (!) 105/50 (!) 108/59 113/60  Pulse: 96 84 92 96  Resp: (!) 23 15 (!) 21 20  Temp:   98.2 F (36.8 C) 98.4 F (36.9 C)  TempSrc:   Oral Oral   SpO2: 100% 99% 100% 95%  Weight:      Height:        Intake/Output Summary (Last 24 hours) at 03/21/2022 1110 Last data filed at 03/21/2022 1034 Gross per 24 hour  Intake 1938.16 ml  Output 1300 ml  Net 638.16 ml   Filed Weights   03/20/22 1833  Weight: 70.3 kg    Examination:  Physical Exam: GEN: NAD, alert and oriented x 3, wd/wn, pale in complexion HEENT: NCAT, PERRL, EOMI, sclera clear, pale conjunctiva, MMM PULM: CTAB w/o wheezes/crackles, normal respiratory effort, on room air CV: RRR w/o M/G/R GI: abd soft, NTND, NABS, no R/G/M MSK: no peripheral edema, muscle strength globally intact 5/5 bilateral upper/lower extremities NEURO: CN II-XII intact, no focal deficits, sensation to light touch intact PSYCH: normal mood/affect Integumentary: Pale skin complexion, otherwise no other concerning rashes/lesions/wounds    Data Reviewed: I have personally reviewed following labs and imaging studies  CBC: Recent Labs  Lab 03/20/22 1605 03/21/22 0320  WBC 5.8 5.1  NEUTROABS 4.1  --   HGB 2.5* 5.4*  HCT 10.4* 18.5*  MCV 70.7* 77.4*  PLT 493* 774*   Basic Metabolic Panel: Recent Labs  Lab 03/20/22 1605 03/21/22 0320  NA 134* 137  K 3.7 3.7  CL 106 108  CO2 21* 20*  GLUCOSE 120* 101*  BUN 20 16  CREATININE 0.80 0.78  CALCIUM 8.5* 8.3*  MG  --  2.1   GFR: Estimated Creatinine Clearance: 71 mL/min (by C-G formula based on SCr of 0.78 mg/dL). Liver Function Tests: Recent Labs  Lab 03/20/22 1605 03/21/22 0320  AST 17 13*  ALT 12 12  ALKPHOS 54 47  BILITOT 0.6 1.7*  PROT 6.3* 5.7*  ALBUMIN 3.3* 3.1*   Recent Labs  Lab 03/20/22 1605  LIPASE 46   No results for input(s): "AMMONIA" in the last 168 hours. Coagulation Profile: Recent Labs  Lab 03/20/22 1605  INR 1.1   Cardiac Enzymes: No results for input(s): "CKTOTAL", "CKMB", "CKMBINDEX", "TROPONINI" in the last 168 hours. BNP (last 3 results) No results for input(s): "PROBNP" in the last 8760  hours. HbA1C: No results for input(s): "HGBA1C" in the last 72 hours. CBG: No results for input(s): "GLUCAP" in the last 168 hours. Lipid Profile: No results for input(s): "CHOL", "HDL", "LDLCALC", "TRIG", "CHOLHDL", "LDLDIRECT" in the last 72 hours. Thyroid Function Tests: Recent Labs    03/21/22 0320  TSH 10.251*   Anemia Panel: Recent Labs    03/20/22 2050  VITAMINB12 115*  FOLATE 16.7  FERRITIN 5*  TIBC 593*  IRON 14*  RETICCTPCT 7.2*   Sepsis Labs: No results for input(s): "PROCALCITON", "LATICACIDVEN" in the last 168 hours.  Recent Results (from the past 240 hour(s))  MRSA Next Gen by PCR, Nasal     Status: None   Collection Time: 03/20/22  7:24 PM   Specimen: Nasal Mucosa; Nasal Swab  Result Value Ref Range Status   MRSA by PCR Next Gen NOT DETECTED NOT DETECTED Final    Comment: (NOTE) The GeneXpert MRSA Assay (FDA approved for NASAL specimens only), is one component of a comprehensive MRSA colonization surveillance program. It is not intended to diagnose MRSA infection nor to guide  or monitor treatment for MRSA infections. Test performance is not FDA approved in patients less than 65 years old. Performed at [facility], [place] [address] [place], [place] [address]          Radiology Studies: No results found.      Scheduled Meds:  Chlorhexidine Gluconate Cloth  6 each Topical Q0600   [START ON 03/24/2022] pantoprazole  40 mg Intravenous Q12H   [START ON 03/22/2022] thyroid  120 mg Oral QAC breakfast   Continuous Infusions:  ferric gluconate (FERRLECIT) IVPB Stopped (03/21/22 1008)   pantoprazole 8 mg/hr (03/21/22 0800)     LOS: 1 day    Time spent: 51 minutes spent on chart review, discussion with nursing staff, consultants, updating family and interview/physical exam; more than 50% of that time was spent in counseling and/or coordination of care.    Brae Gartman J [place] DO [facility] Available via Epic secure chat  7am-7pm After these hours, please refer to coverage provider listed on [email or web address] 03/21/2022, 11:10 AM

## 2022-03-21 NOTE — Plan of Care (Signed)
  Problem: Clinical Measurements: Goal: Ability to maintain clinical measurements within normal limits will improve Outcome: Progressing   Problem: Activity: Goal: Risk for activity intolerance will decrease Outcome: Progressing   Problem: Elimination: Goal: Will not experience complications related to urinary retention Outcome: Progressing   Problem: Pain Managment: Goal: General experience of comfort will improve Outcome: Progressing

## 2022-03-22 LAB — BPAM RBC
Blood Product Expiration Date: 202310202359
Blood Product Expiration Date: 202310202359
Blood Product Expiration Date: 202310232359
Blood Product Expiration Date: 202310232359
ISSUE DATE / TIME: 202309282100
ISSUE DATE / TIME: 202309282302
ISSUE DATE / TIME: 202309290557
ISSUE DATE / TIME: 202309291010
Unit Type and Rh: 6200
Unit Type and Rh: 6200
Unit Type and Rh: 6200
Unit Type and Rh: 6200

## 2022-03-22 LAB — CBC
HCT: 25.7 % — ABNORMAL LOW (ref 36.0–46.0)
Hemoglobin: 7.8 g/dL — ABNORMAL LOW (ref 12.0–15.0)
MCH: 24.6 pg — ABNORMAL LOW (ref 26.0–34.0)
MCHC: 30.4 g/dL (ref 30.0–36.0)
MCV: 81.1 fL (ref 80.0–100.0)
Platelets: 258 10*3/uL (ref 150–400)
RBC: 3.17 MIL/uL — ABNORMAL LOW (ref 3.87–5.11)
RDW: 21 % — ABNORMAL HIGH (ref 11.5–15.5)
WBC: 4.2 10*3/uL (ref 4.0–10.5)
nRBC: 3.3 % — ABNORMAL HIGH (ref 0.0–0.2)

## 2022-03-22 LAB — TYPE AND SCREEN
ABO/RH(D): A POS
Antibody Screen: NEGATIVE
Unit division: 0
Unit division: 0
Unit division: 0
Unit division: 0

## 2022-03-22 LAB — HEMOGLOBIN AND HEMATOCRIT, BLOOD
HCT: 30.5 % — ABNORMAL LOW (ref 36.0–46.0)
Hemoglobin: 9 g/dL — ABNORMAL LOW (ref 12.0–15.0)

## 2022-03-22 LAB — HIV ANTIBODY (ROUTINE TESTING W REFLEX): HIV Screen 4th Generation wRfx: NONREACTIVE

## 2022-03-22 LAB — T4, FREE: Free T4: 0.66 ng/dL (ref 0.61–1.12)

## 2022-03-22 NOTE — Progress Notes (Signed)
PROGRESS NOTE    Patricia Olsen  TLX:[id number] DOB: 09-Nov-1960 DOA: 03/20/2022 PCP: Patient, No Pcp Per    Brief Narrative:   Patricia Olsen is a 61 y.o. female with past medical history significant for hypothyroidism who presented to [facility] ED on 9/28 with 1 week history of progressive fatigue, shortness of breath worse with exertion, episodes of dark stool.  Husband also reports that she has become more pale in complexion over this timeframe as well and patient endorses increased cravings for ice.  Patient denies any use of antiplatelets/anticoagulants but has used increased NSAIDs over the past 2 to 3 days due to a sinus headache.  Additionally patient was recently seen by her primary care physician with noted low hemoglobin and started on iron supplementation over the last few days.  Patient denies any hematemesis or gross blood in her stool.  Patient has had a colonoscopy back in 2016 with Dr. Lu Duffel got only remarkable for diverticula.  Further denies any unintentional weight loss.   In the ED, temperature 98.8 F, HR 111, RR 23, BP 112/62, SPO2 97% on room air.  WBC count 5.8, hemoglobin 2.5, platelets 493, MCV 70.7.  Sodium 134, potassium 3.7, chloride 106, CO2 21, BUN 20, creatinine 0.80.  Glucose 120.  INR 1.1.  EDP ordered 2 units.  Receives to be transfused.  TRH consulted for admission and further evaluation and management of acute symptomatic anemia concerning for possible upper GI bleed.  Assessment & Plan:   Symptomatic anemia concerning for UGIB Iron deficiency anemia Patient presenting to ED with progressive fatigue, shortness of breath worse with exertion and pale complexion with associated dark stools over the last week.  Denies antiplatelet/anticoagulant use, no significant alcohol abuse but does endorse some increased use of NSAIDs during this timeframe.  Patient was found to have a hemoglobin of 2.5 with MCV of 70.7.  No previous labs available for review in EMR or care everywhere  for comparison.  Anemia panel with iron 14, TIBC elevated at 593, ferritin 5, folate 16.7, B12 115. --[name] following, appreciate assistance --Hgb 2.5>5.4>8.6>8.5>7.8 --s/p 2u pRBC 9/28, 2u pRBC 9/29 --repeat IV iron today --maintain hemoglobin greater than 7.0 --Protonix '40mg'$  IV q12h --Repeat H&H this afternoon and CBC in a.m. --GI plans EGD/colonoscopy on 10/2 --Monitor on telemetry  Vitamin B12 deficiency Vitamin B12 low at 115, possibly contributing to her underlying anemia. --B12 1000 mcg IM weekly   Hypothyroidism TSH elevated at 10.251. --Free T40.66, within normal limits --Free T3: penidng --Continue home Armour Thyroid '120mg'$  PO daily   DVT prophylaxis: SCDs Start: 03/20/22 2151    Code Status: Full Code Family Communication: No family present at bedside this morning  Disposition Plan:  Level of care: Telemetry Status is: Inpatient Remains inpatient appropriate because: IV iron, plan EGD/colonoscopy on Monday  Consultants:  [facility] gastroenterology  Procedures:  None  Antimicrobials:  None   Subjective: Patient seen examined bedside, resting comfortably.  Lying in bed.  No specific complaints this morning.  Hemoglobin up to 7.8 this morning.  Slightly concerned about slight drop overnight but reasonable given the improvement from 2.5 with 4 units of PRBCs.  Discussed will recheck hemoglobin again this afternoon and once again tomorrow morning.  No other questions or concerns at this time.  Denies headache, no dizziness, no chest pain, no palpitations, no abdominal pain, no focal weakness, no fever/chills/night sweats, no nausea/vomiting/diarrhea, no cough/congestion, no paresthesias.  No acute events overnight per nursing staff.  Objective: Vitals:   03/22/22  0700 03/22/22 0800 03/22/22 0900 03/22/22 0907  BP: (!) 113/56 123/65    Pulse: 89 94 100   Resp: (!) 22 (!) 24 (!) 25   Temp:    98.3 F (36.8 C)  TempSrc:    Oral  SpO2: 95% 91% 97%    Weight:      Height:        Intake/Output Summary (Last 24 hours) at 03/22/2022 1025 Last data filed at 03/22/2022 0800 Gross per 24 hour  Intake 921.41 ml  Output 3300 ml  Net -2378.59 ml   Filed Weights   03/20/22 1833  Weight: 70.3 kg    Examination:  Physical Exam: GEN: NAD, alert and oriented x 3, wd/wn, pale in complexion HEENT: NCAT, PERRL, EOMI, sclera clear, pale conjunctiva, MMM PULM: CTAB w/o wheezes/crackles, normal respiratory effort, on room air CV: RRR w/o M/G/R GI: abd soft, NTND, NABS, no R/G/M MSK: no peripheral edema, muscle strength globally intact 5/5 bilateral upper/lower extremities NEURO: CN II-XII intact, no focal deficits, sensation to light touch intact PSYCH: normal mood/affect Integumentary: Pale skin complexion, otherwise no other concerning rashes/lesions/wounds    Data Reviewed: I have personally reviewed following labs and imaging studies  CBC: Recent Labs  Lab 03/20/22 1605 03/21/22 0320 03/21/22 1346 03/21/22 1928 03/22/22 0330  WBC 5.8 5.1  --   --  4.2  NEUTROABS 4.1  --   --   --   --   HGB 2.5* 5.4* 8.6* 8.5* 7.8*  HCT 10.4* 18.5* 28.2* 28.3* 25.7*  MCV 70.7* 77.4*  --   --  81.1  PLT 493* 459*  --   --  465   Basic Metabolic Panel: Recent Labs  Lab 03/20/22 1605 03/21/22 0320  NA 134* 137  K 3.7 3.7  CL 106 108  CO2 21* 20*  GLUCOSE 120* 101*  BUN 20 16  CREATININE 0.80 0.78  CALCIUM 8.5* 8.3*  MG  --  2.1   GFR: Estimated Creatinine Clearance: 71 mL/min (by C-G formula based on SCr of 0.78 mg/dL). Liver Function Tests: Recent Labs  Lab 03/20/22 1605 03/21/22 0320  AST 17 13*  ALT 12 12  ALKPHOS 54 47  BILITOT 0.6 1.7*  PROT 6.3* 5.7*  ALBUMIN 3.3* 3.1*   Recent Labs  Lab 03/20/22 1605  LIPASE 46   No results for input(s): "AMMONIA" in the last 168 hours. Coagulation Profile: Recent Labs  Lab 03/20/22 1605  INR 1.1   Cardiac Enzymes: No results for input(s): "CKTOTAL", "CKMB", "CKMBINDEX",  "TROPONINI" in the last 168 hours. BNP (last 3 results) No results for input(s): "PROBNP" in the last 8760 hours. HbA1C: No results for input(s): "HGBA1C" in the last 72 hours. CBG: No results for input(s): "GLUCAP" in the last 168 hours. Lipid Profile: No results for input(s): "CHOL", "HDL", "LDLCALC", "TRIG", "CHOLHDL", "LDLDIRECT" in the last 72 hours. Thyroid Function Tests: Recent Labs    03/21/22 0320 03/22/22 0330  TSH 10.251*  --   FREET4  --  0.66   Anemia Panel: Recent Labs    03/20/22 2050  VITAMINB12 115*  FOLATE 16.7  FERRITIN 5*  TIBC 593*  IRON 14*  RETICCTPCT 7.2*   Sepsis Labs: No results for input(s): "PROCALCITON", "LATICACIDVEN" in the last 168 hours.  Recent Results (from the past 240 hour(s))  MRSA Next Gen by PCR, Nasal     Status: None   Collection Time: 03/20/22  7:24 PM   Specimen: Nasal Mucosa; Nasal Swab  Result Value Ref  Range Status   MRSA by PCR Next Gen NOT DETECTED NOT DETECTED Final    Comment: (NOTE) The GeneXpert MRSA Assay (FDA approved for NASAL specimens only), is one component of a comprehensive MRSA colonization surveillance program. It is not intended to diagnose MRSA infection nor to guide or monitor treatment for MRSA infections. Test performance is not FDA approved in patients less than 12 years old. Performed at [facility], [place] [address] [place], [place] [address]          Radiology Studies: No results found.      Scheduled Meds:  Chlorhexidine Gluconate Cloth  6 each Topical Q0600   cyanocobalamin  1,000 mcg Intramuscular Weekly   [START ON 03/24/2022] pantoprazole  40 mg Intravenous Q12H   thyroid  120 mg Oral QAC breakfast   Continuous Infusions:  ferric gluconate (FERRLECIT) IVPB Stopped (03/21/22 1008)     LOS: 2 days    Time spent: 48 minutes spent on chart review, discussion with nursing staff, consultants, updating family and interview/physical exam; more than 50% of  that time was spent in counseling and/or coordination of care.    Pia Jedlicka J [place] DO [facility] Available via Epic secure chat 7am-7pm After these hours, please refer to coverage provider listed on [email or web address] 03/22/2022, 10:25 AM

## 2022-03-22 NOTE — Progress Notes (Signed)
[facility] Progress Note  SUBJECTIVE:   Interval history: Patricia Olsen was seen and evaluated at bedside today. She notes having some yellow/brown stool overnight. She tolerated dinner well. She has no abdominal pain nor chest pain nor shortness of breath. She has no nausea or vomiting. Hgb trend noted, she is on IV iron replacement. She is agreeable to plan for EGD and colonoscopy on 03/24/22.  Past Medical History:  Diagnosis Date   Thyroid disease    Umbilical hernia    Urinary incontinence    Past Surgical History:  Procedure Laterality Date   ABDOMINAL HYSTERECTOMY     CYSTOCELE REPAIR  01/18/2020   Current Facility-Administered Medications  Medication Dose Route Frequency Provider Last Rate Last Admin   acetaminophen (TYLENOL) tablet 650 mg  650 mg Oral Q6H PRN [place] Eric J, DO   650 mg at 03/21/22 2223   Or   acetaminophen (TYLENOL) suppository 650 mg  650 mg Rectal Q6H PRN [place] Eric J, DO       Chlorhexidine Gluconate Cloth 2 % PADS 6 each  6 each Topical Q0600 [place] Eric J, DO   6 each at 03/20/22 2210   cyanocobalamin (VITAMIN B12) injection 1,000 mcg  1,000 mcg Intramuscular Weekly [place] Donnamarie Poag, DO   1,000 mcg at 03/21/22 1318   ondansetron (ZOFRAN) tablet 4 mg  4 mg Oral Q6H PRN [place] Eric J, DO       Or   ondansetron [facility]) injection 4 mg  4 mg Intravenous Q6H PRN [place] Eric J, DO       Oral care mouth rinse  15 mL Mouth Rinse PRN [place] Eric J, DO       [START ON 03/24/2022] pantoprazole (PROTONIX) injection 40 mg  40 mg Intravenous Q12H [place] Eric J, DO       thyroid (ARMOUR) tablet 120 mg  120 mg Oral QAC breakfast [place] Donnamarie Poag, DO   120 mg at 03/22/22 0805   Allergies as of 03/20/2022 - Review Complete 03/20/2022  Allergen Reaction Noted   Codeine Nausea And Vomiting 03/09/2017   Review of Systems:  Review of Systems  Respiratory:  Negative for shortness of breath.   Cardiovascular:  Negative for chest pain.  Gastrointestinal:   Negative for abdominal pain, blood in stool, constipation, diarrhea, nausea and vomiting.    OBJECTIVE:   Temp:  [98.2 F (36.8 C)-99.1 F (37.3 C)] 99.1 F (37.3 C) (09/30 1052) Pulse Rate:  [77-105] 100 (09/30 1052) Resp:  [12-31] 14 (09/30 1052) BP: (100-127)/(51-85) 114/66 (09/30 1052) SpO2:  [88 %-99 %] 97 % (09/30 1052) Last BM Date : 03/22/22 Physical Exam Constitutional:      General: She is not in acute distress.    Appearance: She is not ill-appearing or toxic-appearing.  Cardiovascular:     Rate and Rhythm: Normal rate and regular rhythm.  Pulmonary:     Effort: No respiratory distress.     Breath sounds: Normal breath sounds.  Abdominal:     General: Bowel sounds are normal. There is no distension.     Palpations: Abdomen is soft.     Tenderness: There is no abdominal tenderness. There is no guarding.  Neurological:     Mental Status: She is alert.     Labs: Recent Labs    03/20/22 1605 03/21/22 0320 03/21/22 1346 03/21/22 1928 03/22/22 0330  WBC 5.8 5.1  --   --  4.2  HGB 2.5* 5.4* 8.6* 8.5* 7.8*  HCT 10.4* 18.5* 28.2* 28.3*  25.7*  PLT 493* 459*  --   --  258   BMET Recent Labs    03/20/22 1605 03/21/22 0320  NA 134* 137  K 3.7 3.7  CL 106 108  CO2 21* 20*  GLUCOSE 120* 101*  BUN 20 16  CREATININE 0.80 0.78  CALCIUM 8.5* 8.3*   LFT Recent Labs    03/21/22 0320  PROT 5.7*  ALBUMIN 3.1*  AST 13*  ALT 12  ALKPHOS 47  BILITOT 1.7*   PT/INR Recent Labs    03/20/22 1605  LABPROT 14.1  INR 1.1   Diagnostic imaging: No results found.  IMPRESSION: Microcytic, symptomatic, iron deficiency anemia Possible melena History hyperplastic polyp History diverticulosis Other: hypothyroidism  PLAN: - Trend H/H, transfuse for Hgb < 7  - No signs of active GI blood loss at this time - Ok for diet as ordered today, recommend clear liquid diet starting 03/23/22 - Plan for EGD and colonoscopy on 03/24/22 - GI will follow   LOS: 2 days    Danton Clap, DO [facility]

## 2022-03-22 NOTE — Plan of Care (Signed)

## 2022-03-23 LAB — CBC
HCT: 29.7 % — ABNORMAL LOW (ref 36.0–46.0)
Hemoglobin: 8.6 g/dL — ABNORMAL LOW (ref 12.0–15.0)
MCH: 25 pg — ABNORMAL LOW (ref 26.0–34.0)
MCHC: 29 g/dL — ABNORMAL LOW (ref 30.0–36.0)
MCV: 86.3 fL (ref 80.0–100.0)
Platelets: 223 10*3/uL (ref 150–400)
RBC: 3.44 MIL/uL — ABNORMAL LOW (ref 3.87–5.11)
RDW: 23 % — ABNORMAL HIGH (ref 11.5–15.5)
WBC: 3.8 10*3/uL — ABNORMAL LOW (ref 4.0–10.5)
nRBC: 3.6 % — ABNORMAL HIGH (ref 0.0–0.2)

## 2022-03-23 LAB — T3, FREE: T3, Free: 2 pg/mL (ref 2.0–4.4)

## 2022-03-23 MED ORDER — BISACODYL 5 MG PO TBEC
5.0000 mg | DELAYED_RELEASE_TABLET | Freq: Once | ORAL | Status: AC
  Administered 2022-03-23: 5 mg via ORAL
  Filled 2022-03-23: qty 1

## 2022-03-23 MED ORDER — POLYETHYLENE GLYCOL 3350 17 GM/SCOOP PO POWD
1.0000 | Freq: Once | ORAL | Status: AC
  Administered 2022-03-23: 255 g via ORAL
  Filled 2022-03-23: qty 255

## 2022-03-23 NOTE — Plan of Care (Signed)
  Problem: Education: Goal: Knowledge of General Education information will improve Description: Including pain rating scale, medication(s)/side effects and non-pharmacologic comfort measures Outcome: Progressing   Problem: Clinical Measurements: Goal: Diagnostic test results will improve Outcome: Progressing   

## 2022-03-23 NOTE — Progress Notes (Signed)
PROGRESS NOTE    Dallana Mavity  LNZ:[id number] DOB: [birth year] DOA: 03/20/2022 PCP: Patient, No Pcp Per    Brief Narrative:   Tiwana Chavis is a 61 y.o. female with past medical history significant for hypothyroidism who presented to [facility] ED on 9/28 with 1 week history of progressive fatigue, shortness of breath worse with exertion, episodes of dark stool.  Husband also reports that she has become more pale in complexion over this timeframe as well and patient endorses increased cravings for ice.  Patient denies any use of antiplatelets/anticoagulants but has used increased NSAIDs over the past 2 to 3 days due to a sinus headache.  Additionally patient was recently seen by her primary care physician with noted low hemoglobin and started on iron supplementation over the last few days.  Patient denies any hematemesis or gross blood in her stool.  Patient has had a colonoscopy back in 2016 with Dr. Lu Duffel got only remarkable for diverticula.  Further denies any unintentional weight loss.   In the ED, temperature 98.8 F, HR 111, RR 23, BP 112/62, SPO2 97% on room air.  WBC count 5.8, hemoglobin 2.5, platelets 493, MCV 70.7.  Sodium 134, potassium 3.7, chloride 106, CO2 21, BUN 20, creatinine 0.80.  Glucose 120.  INR 1.1.  EDP ordered 2 units.  Receives to be transfused.  TRH consulted for admission and further evaluation and management of acute symptomatic anemia concerning for possible upper GI bleed.  Assessment & Plan:   Symptomatic anemia concerning for UGIB Iron deficiency anemia Patient presenting to ED with progressive fatigue, shortness of breath worse with exertion and pale complexion with associated dark stools over the last week.  Denies antiplatelet/anticoagulant use, no significant alcohol abuse but does endorse some increased use of NSAIDs during this timeframe.  Patient was found to have a hemoglobin of 2.5 with MCV of 70.7.  No previous labs available for review in EMR or care everywhere  for comparison.  Anemia panel with iron 14, TIBC elevated at 593, ferritin 5, folate 16.7, B12 115. --[name] following, appreciate assistance --Hgb 2.5>5.4>8.6>8.5>7.8>9.0>8.6 --s/p 2u pRBC 9/28, 2u pRBC 9/29, s/p IV x 2. --maintain hemoglobin greater than 7.0 --Protonix '40mg'$  IV q12h --CBC in a.m. --GI plans EGD/colonoscopy tomorrow, clear liquid diet and n.p.o. after midnight --Monitor on telemetry  Vitamin B12 deficiency Vitamin B12 low at 115, possibly contributing to her underlying anemia. --B12 1000 mcg IM weekly   Hypothyroidism TSH elevated at 10.251. --Free T40.66, within normal limits --Free T3: penidng --Continue home Armour Thyroid '120mg'$  PO daily   DVT prophylaxis: SCDs Start: 03/20/22 2151    Code Status: Full Code Family Communication: No family present at bedside this morning  Disposition Plan:  Level of care: Telemetry Status is: Inpatient Remains inpatient appropriate because: plan EGD/colonoscopy tomorrow, anticipate discharge home in 1-2 days  Consultants:  [facility] gastroenterology  Procedures:  None  Antimicrobials:  None   Subjective: Patient seen examined bedside, resting comfortably.  Lying in bed.  No specific complaints this morning.  Hemoglobin up to 8.6 this morning.  Pending EGD/colonoscopy tomorrow.  Dyspnea, fatigue now resolved.  No other questions or concerns at this time.  Denies headache, no dizziness, no chest pain, no palpitations, no abdominal pain, no focal weakness, no fever/chills/night sweats, no nausea/vomiting/diarrhea, no cough/congestion, no paresthesias.  No acute events overnight per nursing staff.  Objective: Vitals:   03/22/22 1052 03/22/22 1519 03/22/22 2051 03/23/22 0551  BP: 114/66 118/67 121/80 102/64  Pulse: 100 (!) 103 (!)  105 88  Resp: '14 14 18 18  '$ Temp: 99.1 F (37.3 C) 98.9 F (37.2 C) 97.8 F (36.6 C) 98.4 F (36.9 C)  TempSrc: Oral Axillary Oral Oral  SpO2: 97% 98% 99% 94%  Weight:       Height:        Intake/Output Summary (Last 24 hours) at 03/23/2022 1108 Last data filed at 03/23/2022 0100 Gross per 24 hour  Intake --  Output 600 ml  Net -600 ml   Filed Weights   03/20/22 1833  Weight: 70.3 kg    Examination:  Physical Exam: GEN: NAD, alert and oriented x 3, wd/wn HEENT: NCAT, PERRL, EOMI, sclera clear, MMM PULM: CTAB w/o wheezes/crackles, normal respiratory effort, on room air CV: RRR w/o M/G/R GI: abd soft, NTND, NABS, no R/G/M MSK: no peripheral edema, muscle strength globally intact 5/5 bilateral upper/lower extremities NEURO: CN II-XII intact, no focal deficits, sensation to light touch intact PSYCH: normal mood/affect Integumentary:  no concerning rashes/lesions/wounds    Data Reviewed: I have personally reviewed following labs and imaging studies  CBC: Recent Labs  Lab 03/20/22 1605 03/21/22 0320 03/21/22 1346 03/21/22 1928 03/22/22 0330 03/22/22 1336 03/23/22 0534  WBC 5.8 5.1  --   --  4.2  --  3.8*  NEUTROABS 4.1  --   --   --   --   --   --   HGB 2.5* 5.4* 8.6* 8.5* 7.8* 9.0* 8.6*  HCT 10.4* 18.5* 28.2* 28.3* 25.7* 30.5* 29.7*  MCV 70.7* 77.4*  --   --  81.1  --  86.3  PLT 493* 459*  --   --  258  --  161   Basic Metabolic Panel: Recent Labs  Lab 03/20/22 1605 03/21/22 0320  NA 134* 137  K 3.7 3.7  CL 106 108  CO2 21* 20*  GLUCOSE 120* 101*  BUN 20 16  CREATININE 0.80 0.78  CALCIUM 8.5* 8.3*  MG  --  2.1   GFR: Estimated Creatinine Clearance: 71 mL/min (by C-G formula based on SCr of 0.78 mg/dL). Liver Function Tests: Recent Labs  Lab 03/20/22 1605 03/21/22 0320  AST 17 13*  ALT 12 12  ALKPHOS 54 47  BILITOT 0.6 1.7*  PROT 6.3* 5.7*  ALBUMIN 3.3* 3.1*   Recent Labs  Lab 03/20/22 1605  LIPASE 46   No results for input(s): "AMMONIA" in the last 168 hours. Coagulation Profile: Recent Labs  Lab 03/20/22 1605  INR 1.1   Cardiac Enzymes: No results for input(s): "CKTOTAL", "CKMB", "CKMBINDEX",  "TROPONINI" in the last 168 hours. BNP (last 3 results) No results for input(s): "PROBNP" in the last 8760 hours. HbA1C: No results for input(s): "HGBA1C" in the last 72 hours. CBG: No results for input(s): "GLUCAP" in the last 168 hours. Lipid Profile: No results for input(s): "CHOL", "HDL", "LDLCALC", "TRIG", "CHOLHDL", "LDLDIRECT" in the last 72 hours. Thyroid Function Tests: Recent Labs    03/21/22 0320 03/22/22 0330  TSH 10.251*  --   FREET4  --  0.66   Anemia Panel: Recent Labs    03/20/22 2050  VITAMINB12 115*  FOLATE 16.7  FERRITIN 5*  TIBC 593*  IRON 14*  RETICCTPCT 7.2*   Sepsis Labs: No results for input(s): "PROCALCITON", "LATICACIDVEN" in the last 168 hours.  Recent Results (from the past 240 hour(s))  MRSA Next Gen by PCR, Nasal     Status: None   Collection Time: 03/20/22  7:24 PM   Specimen: Nasal Mucosa; Nasal Swab  Result Value Ref Range Status   MRSA by PCR Next Gen NOT DETECTED NOT DETECTED Final    Comment: (NOTE) The GeneXpert MRSA Assay (FDA approved for NASAL specimens only), is one component of a comprehensive MRSA colonization surveillance program. It is not intended to diagnose MRSA infection nor to guide or monitor treatment for MRSA infections. Test performance is not FDA approved in patients less than 49 years old. Performed at [facility], [place] [address], [place], [place] [address]          Radiology Studies: No results found.      Scheduled Meds:  Chlorhexidine Gluconate Cloth  6 each Topical Q0600   cyanocobalamin  1,000 mcg Intramuscular Weekly   [START ON 03/24/2022] pantoprazole  40 mg Intravenous Q12H   thyroid  120 mg Oral QAC breakfast   Continuous Infusions:     LOS: 3 days    Time spent: 48 minutes spent on chart review, discussion with nursing staff, consultants, updating family and interview/physical exam; more than 50% of that time was spent in counseling and/or coordination of  care.    Lianna Sitzmann J [place] DO [facility] Available via Epic secure chat 7am-7pm After these hours, please refer to coverage provider listed on [email or web address] 03/23/2022, 11:08 AM

## 2022-03-23 NOTE — Progress Notes (Signed)
[facility] Progress Note  SUBJECTIVE:   Interval history: Patricia Olsen was seen and evaluated today at bedside. Resting comfortably with her spouse at bedside. No abdominal pain, nausea or vomiting. Was walking halls before. Ready to start prep for EGD and colonoscopy on 10/2. Hgb trend reviewed.  Past Medical History:  Diagnosis Date   Thyroid disease    Umbilical hernia    Urinary incontinence    Past Surgical History:  Procedure Laterality Date   ABDOMINAL HYSTERECTOMY     CYSTOCELE REPAIR  01/18/2020   Current Facility-Administered Medications  Medication Dose Route Frequency Provider Last Rate Last Admin   acetaminophen (TYLENOL) tablet 650 mg  650 mg Oral Q6H PRN [place] Eric J, DO   650 mg at 03/22/22 2302   Or   acetaminophen (TYLENOL) suppository 650 mg  650 mg Rectal Q6H PRN [place] Eric J, DO       Chlorhexidine Gluconate Cloth 2 % PADS 6 each  6 each Topical Q0600 [place] Eric J, DO   6 each at 03/20/22 2210   cyanocobalamin (VITAMIN B12) injection 1,000 mcg  1,000 mcg Intramuscular Weekly [place] Donnamarie Poag, DO   1,000 mcg at 03/21/22 1318   ondansetron (ZOFRAN) tablet 4 mg  4 mg Oral Q6H PRN [place] Eric J, DO       Or   ondansetron [facility]) injection 4 mg  4 mg Intravenous Q6H PRN [place] Eric J, DO       Oral care mouth rinse  15 mL Mouth Rinse PRN [place] Eric J, DO       [START ON 03/24/2022] pantoprazole (PROTONIX) injection 40 mg  40 mg Intravenous Q12H [place] Eric J, DO       thyroid (ARMOUR) tablet 120 mg  120 mg Oral QAC breakfast [place] Donnamarie Poag, DO   120 mg at 03/23/22 1031   Allergies as of 03/20/2022 - Review Complete 03/20/2022  Allergen Reaction Noted   Codeine Nausea And Vomiting 03/09/2017   Review of Systems:  Review of Systems  Respiratory:  Negative for shortness of breath.   Cardiovascular:  Negative for chest pain.  Gastrointestinal:  Negative for abdominal pain.    OBJECTIVE:   Temp:  [97.8 F (36.6 C)-98.4 F (36.9  C)] 98 F (36.7 C) (10/01 1328) Pulse Rate:  [88-105] 99 (10/01 1328) Resp:  [18] 18 (10/01 1328) BP: (102-121)/(64-80) 120/69 (10/01 1328) SpO2:  [94 %-100 %] 100 % (10/01 1328) Last BM Date : 03/22/22 Physical Exam Constitutional:      General: She is not in acute distress.    Appearance: She is not ill-appearing, toxic-appearing or diaphoretic.  Cardiovascular:     Rate and Rhythm: Normal rate and regular rhythm.  Pulmonary:     Effort: No respiratory distress.     Breath sounds: Normal breath sounds.  Abdominal:     General: Bowel sounds are normal. There is no distension.     Palpations: Abdomen is soft.     Tenderness: There is no abdominal tenderness. There is no guarding.  Neurological:     Mental Status: She is alert.     Labs: Recent Labs    03/21/22 0320 03/21/22 1346 03/22/22 0330 03/22/22 1336 03/23/22 0534  WBC 5.1  --  4.2  --  3.8*  HGB 5.4*   < > 7.8* 9.0* 8.6*  HCT 18.5*   < > 25.7* 30.5* 29.7*  PLT 459*  --  258  --  223   < > = values in  this interval not displayed.   BMET Recent Labs    03/21/22 0320  NA 137  K 3.7  CL 108  CO2 20*  GLUCOSE 101*  BUN 16  CREATININE 0.78  CALCIUM 8.3*   LFT Recent Labs    03/21/22 0320  PROT 5.7*  ALBUMIN 3.1*  AST 13*  ALT 12  ALKPHOS 47  BILITOT 1.7*   PT/INR No results for input(s): "LABPROT", "INR" in the last 72 hours. Diagnostic imaging: No results found.  IMPRESSION: Microcytic, symptomatic, iron deficiency anemia Possible melena History hyperplastic polyp History diverticulosis Other: hypothyroidism  PLAN: - Trend H/H, transfuse for Hgb < 7  - Clear liquid diet today, bowel preparation this afternoon - NPO at midnight for EGD and colonoscopy 03/24/22   LOS: 3 days   Danton Clap, [facility]

## 2022-03-23 NOTE — Plan of Care (Signed)
Pt alert and oriented x 4. Vitals stable. Med compliant. Prn tylenol given for headache during overnight and was effective. IV removed from right forearm. Slight swelling noted and pink discoloration. Arm elevated and heat pack placed. Decrease in swelling and discoloration noted. Educated patient on notify staff if redness increases. Diet changed this am to clear in preparation for colonoscopy 10/2. Problem: Education: Goal: Knowledge of General Education information will improve Description: Including pain rating scale, medication(s)/side effects and non-pharmacologic comfort measures Outcome: Progressing   Problem: Health Behavior/Discharge Planning: Goal: Ability to manage health-related needs will improve Outcome: Progressing   Problem: Clinical Measurements: Goal: Ability to maintain clinical measurements within normal limits will improve Outcome: Progressing Goal: Will remain free from infection Outcome: Progressing Goal: Diagnostic test results will improve Outcome: Progressing Goal: Respiratory complications will improve Outcome: Progressing Goal: Cardiovascular complication will be avoided Outcome: Progressing   Problem: Activity: Goal: Risk for activity intolerance will decrease Outcome: Progressing   Problem: Nutrition: Goal: Adequate nutrition will be maintained Outcome: Progressing   Problem: Coping: Goal: Level of anxiety will decrease Outcome: Progressing   Problem: Elimination: Goal: Will not experience complications related to bowel motility Outcome: Progressing Goal: Will not experience complications related to urinary retention Outcome: Progressing   Problem: Pain Managment: Goal: General experience of comfort will improve Outcome: Progressing   Problem: Safety: Goal: Ability to remain free from injury will improve Outcome: Progressing   Problem: Skin Integrity: Goal: Risk for impaired skin integrity will decrease Outcome: Progressing

## 2022-03-23 NOTE — H&P (View-Only) (Signed)
[facility] Progress Note  SUBJECTIVE:   Interval history: Patricia Olsen was seen and evaluated today at bedside. Resting comfortably with her spouse at bedside. No abdominal pain, nausea or vomiting. Was walking halls before. Ready to start prep for EGD and colonoscopy on 10/2. Hgb trend reviewed.  Past Medical History:  Diagnosis Date   Thyroid disease    Umbilical hernia    Urinary incontinence    Past Surgical History:  Procedure Laterality Date   ABDOMINAL HYSTERECTOMY     CYSTOCELE REPAIR  01/18/2020   Current Facility-Administered Medications  Medication Dose Route Frequency Provider Last Rate Last Admin   acetaminophen (TYLENOL) tablet 650 mg  650 mg Oral Q6H PRN [place] Eric J, DO   650 mg at 03/22/22 2302   Or   acetaminophen (TYLENOL) suppository 650 mg  650 mg Rectal Q6H PRN [place] Eric J, DO       Chlorhexidine Gluconate Cloth 2 % PADS 6 each  6 each Topical Q0600 [place] Eric J, DO   6 each at 03/20/22 2210   cyanocobalamin (VITAMIN B12) injection 1,000 mcg  1,000 mcg Intramuscular Weekly [place] Donnamarie Poag, DO   1,000 mcg at 03/21/22 1318   ondansetron (ZOFRAN) tablet 4 mg  4 mg Oral Q6H PRN [place] Eric J, DO       Or   ondansetron [facility]) injection 4 mg  4 mg Intravenous Q6H PRN [place] Eric J, DO       Oral care mouth rinse  15 mL Mouth Rinse PRN [place] Eric J, DO       [START ON 03/24/2022] pantoprazole (PROTONIX) injection 40 mg  40 mg Intravenous Q12H [place] Eric J, DO       thyroid (ARMOUR) tablet 120 mg  120 mg Oral QAC breakfast [place] Donnamarie Poag, DO   120 mg at 03/23/22 1031   Allergies as of 03/20/2022 - Review Complete 03/20/2022  Allergen Reaction Noted   Codeine Nausea And Vomiting 03/09/2017   Review of Systems:  Review of Systems  Respiratory:  Negative for shortness of breath.   Cardiovascular:  Negative for chest pain.  Gastrointestinal:  Negative for abdominal pain.    OBJECTIVE:   Temp:  [97.8 F (36.6 C)-98.4 F (36.9  C)] 98 F (36.7 C) (10/01 1328) Pulse Rate:  [88-105] 99 (10/01 1328) Resp:  [18] 18 (10/01 1328) BP: (102-121)/(64-80) 120/69 (10/01 1328) SpO2:  [94 %-100 %] 100 % (10/01 1328) Last BM Date : 03/22/22 Physical Exam Constitutional:      General: She is not in acute distress.    Appearance: She is not ill-appearing, toxic-appearing or diaphoretic.  Cardiovascular:     Rate and Rhythm: Normal rate and regular rhythm.  Pulmonary:     Effort: No respiratory distress.     Breath sounds: Normal breath sounds.  Abdominal:     General: Bowel sounds are normal. There is no distension.     Palpations: Abdomen is soft.     Tenderness: There is no abdominal tenderness. There is no guarding.  Neurological:     Mental Status: She is alert.     Labs: Recent Labs    03/21/22 0320 03/21/22 1346 03/22/22 0330 03/22/22 1336 03/23/22 0534  WBC 5.1  --  4.2  --  3.8*  HGB 5.4*   < > 7.8* 9.0* 8.6*  HCT 18.5*   < > 25.7* 30.5* 29.7*  PLT 459*  --  258  --  223   < > = values in  this interval not displayed.   BMET Recent Labs    03/21/22 0320  NA 137  K 3.7  CL 108  CO2 20*  GLUCOSE 101*  BUN 16  CREATININE 0.78  CALCIUM 8.3*   LFT Recent Labs    03/21/22 0320  PROT 5.7*  ALBUMIN 3.1*  AST 13*  ALT 12  ALKPHOS 47  BILITOT 1.7*   PT/INR No results for input(s): "LABPROT", "INR" in the last 72 hours. Diagnostic imaging: No results found.  IMPRESSION: Microcytic, symptomatic, iron deficiency anemia Possible melena History hyperplastic polyp History diverticulosis Other: hypothyroidism  PLAN: - Trend H/H, transfuse for Hgb < 7  - Clear liquid diet today, bowel preparation this afternoon - NPO at midnight for EGD and colonoscopy 03/24/22   LOS: 3 days   Danton Clap, [facility]

## 2022-03-24 ENCOUNTER — Encounter (HOSPITAL_COMMUNITY)
Admission: EM | Disposition: A | Discharge status: Home / Self Care | Payer: Self-pay | Source: Home / Self Care | Attending: Internal Medicine

## 2022-03-24 ENCOUNTER — Inpatient Hospital Stay (HOSPITAL_COMMUNITY): Payer: 59 | Admitting: Certified Registered"

## 2022-03-24 ENCOUNTER — Encounter (HOSPITAL_COMMUNITY): Payer: Self-pay | Admitting: Internal Medicine

## 2022-03-24 DIAGNOSIS — K222 Esophageal obstruction: Secondary | ICD-10-CM | POA: Diagnosis not present

## 2022-03-24 DIAGNOSIS — K3189 Other diseases of stomach and duodenum: Secondary | ICD-10-CM | POA: Diagnosis not present

## 2022-03-24 DIAGNOSIS — K257 Chronic gastric ulcer without hemorrhage or perforation: Secondary | ICD-10-CM | POA: Diagnosis present

## 2022-03-24 DIAGNOSIS — K259 Gastric ulcer, unspecified as acute or chronic, without hemorrhage or perforation: Secondary | ICD-10-CM | POA: Diagnosis not present

## 2022-03-24 DIAGNOSIS — K449 Diaphragmatic hernia without obstruction or gangrene: Secondary | ICD-10-CM

## 2022-03-24 DIAGNOSIS — K648 Other hemorrhoids: Secondary | ICD-10-CM

## 2022-03-24 DIAGNOSIS — K644 Residual hemorrhoidal skin tags: Secondary | ICD-10-CM

## 2022-03-24 DIAGNOSIS — E039 Hypothyroidism, unspecified: Secondary | ICD-10-CM

## 2022-03-24 DIAGNOSIS — D649 Anemia, unspecified: Secondary | ICD-10-CM

## 2022-03-24 DIAGNOSIS — E538 Deficiency of other specified B group vitamins: Secondary | ICD-10-CM | POA: Diagnosis present

## 2022-03-24 DIAGNOSIS — K621 Rectal polyp: Secondary | ICD-10-CM

## 2022-03-24 DIAGNOSIS — D500 Iron deficiency anemia secondary to blood loss (chronic): Secondary | ICD-10-CM | POA: Diagnosis present

## 2022-03-24 HISTORY — PX: BIOPSY: SHX5522

## 2022-03-24 HISTORY — PX: ESOPHAGOGASTRODUODENOSCOPY: SHX5428

## 2022-03-24 HISTORY — PX: POLYPECTOMY: SHX5525

## 2022-03-24 HISTORY — PX: COLONOSCOPY WITH PROPOFOL: SHX5780

## 2022-03-24 LAB — CBC
HCT: 31.5 % — ABNORMAL LOW (ref 36.0–46.0)
Hemoglobin: 9.1 g/dL — ABNORMAL LOW (ref 12.0–15.0)
MCH: 25.5 pg — ABNORMAL LOW (ref 26.0–34.0)
MCHC: 28.9 g/dL — ABNORMAL LOW (ref 30.0–36.0)
MCV: 88.2 fL (ref 80.0–100.0)
Platelets: 201 10*3/uL (ref 150–400)
RBC: 3.57 MIL/uL — ABNORMAL LOW (ref 3.87–5.11)
RDW: 25.2 % — ABNORMAL HIGH (ref 11.5–15.5)
WBC: 5 10*3/uL (ref 4.0–10.5)
nRBC: 1 % — ABNORMAL HIGH (ref 0.0–0.2)

## 2022-03-24 SURGERY — EGD (ESOPHAGOGASTRODUODENOSCOPY)
Anesthesia: Monitor Anesthesia Care

## 2022-03-24 MED ORDER — PANTOPRAZOLE SODIUM 40 MG IV SOLR: 40.0000 mg | Freq: Every day | INTRAVENOUS | Status: DC

## 2022-03-24 MED ORDER — K2 PLUS D3 100-1000 MCG-UNIT PO TABS: ORAL_TABLET | Freq: Every day | ORAL | Status: DC

## 2022-03-24 MED ORDER — XLEAR SINUS CARE SPRAY NA SOLN: 2.0000 | Freq: Every day | NASAL | Status: DC | PRN

## 2022-03-24 MED ORDER — VITAMIN B-12 100 MCG PO TABS: 100.0000 ug | ORAL_TABLET | Freq: Every day | ORAL | 2 refills | Status: AC

## 2022-03-24 MED ORDER — OXYMETAZOLINE HCL 0.05 % NA SOLN: 1.0000 | Freq: Two times a day (BID) | NASAL | Status: DC | PRN

## 2022-03-24 MED ORDER — LACTATED RINGERS IV SOLN: INTRAVENOUS | Status: DC | PRN

## 2022-03-24 MED ORDER — PROPOFOL 500 MG/50ML IV EMUL
INTRAVENOUS | Status: DC | PRN
  Administered 2022-03-24: 125 ug/kg/min via INTRAVENOUS

## 2022-03-24 MED ORDER — PHENYLEPHRINE 80 MCG/ML (10ML) SYRINGE FOR IV PUSH (FOR BLOOD PRESSURE SUPPORT)
PREFILLED_SYRINGE | INTRAVENOUS | Status: DC | PRN
  Administered 2022-03-24: 160 ug via INTRAVENOUS
  Administered 2022-03-24: 80 ug via INTRAVENOUS

## 2022-03-24 MED ORDER — PROPOFOL 10 MG/ML IV BOLUS
INTRAVENOUS | Status: DC | PRN
  Administered 2022-03-24: 10 mg via INTRAVENOUS
  Administered 2022-03-24: 20 mg via INTRAVENOUS
  Administered 2022-03-24 (×2): 10 mg via INTRAVENOUS

## 2022-03-24 MED ORDER — FERROUS SULFATE 325 (65 FE) MG PO TBEC: 325.0000 mg | DELAYED_RELEASE_TABLET | Freq: Every day | ORAL | 2 refills | Status: AC

## 2022-03-24 MED ORDER — THIAMINE MONONITRATE 100 MG PO TABS
100.0000 mg | ORAL_TABLET | Freq: Every day | ORAL | Status: DC
  Administered 2022-03-24: 100 mg via ORAL
  Filled 2022-03-24: qty 1

## 2022-03-24 MED ORDER — QUERCETIN 50 MG PO TABS: ORAL_TABLET | Freq: Every day | ORAL | Status: DC

## 2022-03-24 MED ORDER — PANTOPRAZOLE SODIUM 40 MG PO TBEC: 40.0000 mg | DELAYED_RELEASE_TABLET | Freq: Every day | ORAL | 2 refills | Status: AC

## 2022-03-24 MED ORDER — LIDOCAINE 2% (20 MG/ML) 5 ML SYRINGE
INTRAMUSCULAR | Status: DC | PRN
  Administered 2022-03-24: 100 mg via INTRAVENOUS

## 2022-03-24 MED ORDER — FERROUS SULFATE 325 (65 FE) MG PO TABS: 325.0000 mg | ORAL_TABLET | Freq: Every day | ORAL | Status: DC

## 2022-03-24 SURGICAL SUPPLY — 22 items

## 2022-03-24 NOTE — Transfer of Care (Signed)
Immediate Anesthesia Transfer of Care Note  Patient: Patricia Olsen  Procedure(s) Performed: ESOPHAGOGASTRODUODENOSCOPY (EGD) COLONOSCOPY WITH PROPOFOL  Patient Location: PACU and Endoscopy Unit  Anesthesia Type:MAC  Level of Consciousness: awake, alert  and patient cooperative  Airway & Oxygen Therapy: Patient Spontanous Breathing and Patient connected to face mask oxygen  Post-op Assessment: Report given to RN and Post -op Vital signs reviewed and stable  Post vital signs: Reviewed and stable  Last Vitals:  Vitals Value Taken Time  BP 103/55   Temp    Pulse 84   Resp 20   SpO2 100     Last Pain:  Vitals:   03/24/22 0722  TempSrc: Temporal  PainSc: 0-No pain         Complications: No notable events documented.

## 2022-03-24 NOTE — Progress Notes (Signed)
  Transition of Care [facility]) Screening Note   Patient Details  Name: Patricia Olsen Date of Birth: Jan 29, 1961   Transition of Care [facility]) CM/SW Contact:    Dessa Phi, RN Phone Number: 03/24/2022, 11:36 AM    Transition of Care Department [facility]) has reviewed patient and no TOC needs have been identified at this time. We will continue to monitor patient advancement through interdisciplinary progression rounds. If new patient transition needs arise, please place a TOC consult.

## 2022-03-24 NOTE — Op Note (Signed)
[facility] Patient Name: Patricia Olsen Procedure Date: 03/24/2022 MRN: [id number] Attending MD: Ronnette Juniper , MD Date of Birth: 1961-02-09 CSN: [id number] Age: 61 Admit Type: Inpatient Procedure:                Colonoscopy Indications:              Last colonoscopy: 2016, Unexplained iron deficiency                            anemia Providers:                Ronnette Juniper, MD, Dulcy Fanny, William Dalton,                            Technician Referring MD:             [facility] Medicines:                Monitored Anesthesia Care Complications:            No immediate complications. Estimated blood loss:                            Minimal. Estimated Blood Loss:     Estimated blood loss was minimal. Procedure:                Pre-Anesthesia Assessment:                           - Prior to the procedure, a History and Physical                            was performed, and patient medications and                            allergies were reviewed. The patient's tolerance of                            previous anesthesia was also reviewed. The risks                            and benefits of the procedure and the sedation                            options and risks were discussed with the patient.                            All questions were answered, and informed consent                            was obtained. Prior Anticoagulants: The patient has                            taken no previous anticoagulant or antiplatelet                            agents. ASA Grade Assessment: III - A patient with  severe systemic disease. After reviewing the risks                            and benefits, the patient was deemed in                            satisfactory condition to undergo the procedure.                           - Prior to the procedure, a History and Physical                            was performed, and patient medications and                             allergies were reviewed. The patient's tolerance of                            previous anesthesia was also reviewed. The risks                            and benefits of the procedure and the sedation                            options and risks were discussed with the patient.                            All questions were answered, and informed consent                            was obtained. Prior Anticoagulants: The patient has                            taken no previous anticoagulant or antiplatelet                            agents. ASA Grade Assessment: III - A patient with                            severe systemic disease. After reviewing the risks                            and benefits, the patient was deemed in                            satisfactory condition to undergo the procedure.                           After obtaining informed consent, the colonoscope                            was passed under direct vision. Throughout the  procedure, the patient's blood pressure, pulse, and                            oxygen saturations were monitored continuously. The                            [id number]-HQ190L ([id number] Olympus colonoscope was                            introduced through the anus and advanced to the the                            terminal ileum. The colonoscopy was performed                            without difficulty. The patient tolerated the                            procedure well. The quality of the bowel                            preparation was good. Scope In: 8:10:23 AM Scope Out: 8:23:11 AM Scope Withdrawal Time: 0 hours 9 minutes 17 seconds  Total Procedure Duration: 0 hours 12 minutes 48 seconds  Findings:      Skin tags were found on perianal exam.      Hemorrhoids were found on perianal exam.      The terminal ileum appeared normal.      Scattered small and large-mouthed diverticula were found in the  sigmoid       colon, descending colon, transverse colon and hepatic flexure.      A 5 mm polyp was found in the rectum. The polyp was sessile. The polyp       was removed with a piecemeal technique using a cold biopsy forceps.       Resection and retrieval were complete.      Non-bleeding internal hemorrhoids were found during retroflexion. Impression:               - Perianal skin tags found on perianal exam.                           - Hemorrhoids found on perianal exam.                           - The examined portion of the ileum was normal.                           - Diverticulosis in the sigmoid colon, in the                            descending colon, in the transverse colon and at                            the hepatic flexure.                           -  One 5 mm polyp in the rectum, removed piecemeal                            using a cold biopsy forceps. Resected and retrieved.                           - Non-bleeding internal hemorrhoids. Moderate Sedation:      Patient did not receive moderate sedation for this procedure, but       instead received monitored anesthesia care. Recommendation:           - High fiber diet.                           - Continue present medications.                           - Await pathology results.                           - Repeat colonoscopy for surveillance based on                            pathology results. Procedure Code(s):        --- Professional ---                           [id number], Colonoscopy, flexible; with biopsy, single                            or multiple Diagnosis Code(s):        --- Professional ---                           K64.8, Other hemorrhoids                           K62.1, Rectal polyp                           K64.4, Residual hemorrhoidal skin tags                           D50.9, Iron deficiency anemia, unspecified                           K57.30, Diverticulosis of large intestine without                             perforation or abscess without bleeding CPT copyright 2019 American [employer] Association. All rights reserved. The codes documented in this report are preliminary and upon coder review may  be revised to meet current compliance requirements. Ronnette Juniper, MD 03/24/2022 8:36:41 AM This report has been signed electronically. Number of Addenda: 0

## 2022-03-24 NOTE — Discharge Summary (Signed)
Physician Discharge Summary  Patricia Olsen PPJ:[id number] DOB: [birth year] DOA: 03/20/2022  PCP: Patient, No Pcp Per  Admit date: 03/20/2022 Discharge date: 03/24/2022  Admitted From:  Disposition:    Recommendations for Outpatient Follow-up:  Needs to establish care with PCP and recommend hospital follow-up 1-2 weeks with repeat labs Outpatient follow-up with gastroenterology, Dr. Randel Pigg as scheduled Started on ferrous sulfate 3 and 25 mg p.o. daily for iron deficiency anemia, will need repeat iron studies in 6 weeks Started on Protonix 40 mg p.o. daily for antral ulcers and Cameron lesions on EGD Please obtain CBC in one week to reassess hemoglobin level, 9.1 at time of discharge  Home Health: No Equipment/Devices: None  Discharge Condition: Stable CODE STATUS: Full code Diet recommendation: Regular diet  History of present illness:  Patricia Olsen is a 60 y.o. female with past medical history significant for hypothyroidism who presented to [facility] ED on 9/28 with 1 week history of progressive fatigue, shortness of breath worse with exertion, episodes of dark stool.  Husband also reports that she has become more pale in complexion over this timeframe as well and patient endorses increased cravings for ice.  Patient denies any use of antiplatelets/anticoagulants but has used increased NSAIDs over the past 2 to 3 days due to a sinus headache.  Additionally patient was recently seen by her primary care physician with noted low hemoglobin and started on iron supplementation over the last few days.  Patient denies any hematemesis or gross blood in her stool.  Patient has had a colonoscopy back in 2016 with Dr. Lu Duffel got only remarkable for diverticula.  Further denies any unintentional weight loss.   In the ED, temperature 98.8 F, HR 111, RR 23, BP 112/62, SPO2 97% on room air.  WBC count 5.8, hemoglobin 2.5, platelets 493, MCV 70.7.  Sodium 134, potassium 3.7, chloride 106, CO2 21, BUN 20,  creatinine 0.80.  Glucose 120.  INR 1.1.  EDP ordered 2 units.  Receives to be transfused.  TRH consulted for admission and further evaluation and management of acute symptomatic anemia concerning for possible upper GI bleed.  Hospital course:  Patricia Olsen lesions/antral ulceration Hiatal hernia Symptomatic anemia  Iron deficiency anemia likely from chronic blood loss 2/2 hiatal hernia Patient presenting to ED with progressive fatigue, shortness of breath worse with exertion and pale complexion with associated dark stools over the last week.  Denies antiplatelet/anticoagulant use, no significant alcohol abuse but does endorse some increased use of NSAIDs during this timeframe.  Patient was found to have a hemoglobin of 2.5 with MCV of 70.7.  No previous labs available for review in EMR or care everywhere for comparison.  Anemia panel with iron 14, TIBC elevated at 593, ferritin 5, folate 16.7, B12 115.  Patient was transfused 4 unit PRBCs during hospitalization.  [place] gastro[facility]ology was consulted and followed during hospital course.  Patient underwent EGD/colonoscopy on 03/24/2022 with findings of  large hiatal hernia, cameron lesions and antral ulcers, colon showed 1 polyp, diverticulosis and hemorrhoids.  Etiology of severe anemia is related to chronic slow blood loss from a very large hiatal hernia.  Follow-up with Dr.Vreeland from [facility] gastroenterology as an outpatient for labs in 6 weeks.  Discharging on Protonix 40 mg p.o. daily, ferrous sulfate 325 mg p.o. daily.  Hemoglobin 9.1 at time of discharge.   Vitamin B12 deficiency Vitamin B12 low at 115, possibly contributing to her underlying anemia.  Continue vitamin B12 100 mcg p.o. daily.   Hypothyroidism TSH elevated at 10.251.  Free T4 0.66, and free T32.0 Within normal limits.  Continue home Armour Thyroid '120mg'$  PO daily.  Outpatient follow-up PCP.  Discharge Diagnoses:  Principal Problem:   Symptomatic anemia Active Problems:   Hiatal  hernia   Cameron lesion, chronic   Iron deficiency anemia due to chronic blood loss   B12 deficiency    Discharge Instructions  Discharge Instructions     Call MD for:  difficulty breathing, headache or visual disturbances   Complete by: As directed    Call MD for:  extreme fatigue   Complete by: As directed    Call MD for:  persistant dizziness or light-headedness   Complete by: As directed    Call MD for:  persistant nausea and vomiting   Complete by: As directed    Call MD for:  severe uncontrolled pain   Complete by: As directed    Call MD for:  temperature >100.4   Complete by: As directed    Diet - low sodium heart healthy   Complete by: As directed    Increase activity slowly   Complete by: As directed       Allergies as of 03/24/2022       Reactions   Codeine Nausea And Vomiting        Medication List     TAKE these medications    Armour Thyroid 120 MG tablet Generic drug: thyroid Take 120 mg by mouth daily before breakfast.   ferrous sulfate 325 (65 FE) MG EC tablet Take 1 tablet (325 mg total) by mouth daily with breakfast.   K2 PLUS D3 PO Take 1 tablet by mouth daily.   NON FORMULARY Take 1,000 mg by mouth See admin instructions. Sustained-release Vitamin C 1,000 mg tablets- Take 1,000 mg by mouth one to three times a day   oxymetazoline 0.05 % nasal spray Commonly known as: AFRIN Place 1 spray into both nostrils 2 (two) times daily as needed for congestion (if using for 3 consecutive days, take a 3-day's "break").   pantoprazole 40 MG tablet Commonly known as: Protonix Take 1 tablet (40 mg total) by mouth daily.   QUERCETIN PO Take 1 tablet by mouth daily.   VITAMIN B-1 PO Take 1 tablet by mouth daily.   vitamin B-12 100 MCG tablet Commonly known as: CYANOCOBALAMIN Take 1 tablet (100 mcg total) by mouth daily.   Xlear Sinus Care Spray Soln Place 2 sprays into both nostrils daily as needed (for congestion).        Follow-up  Information     Loney Laurence, DO Follow up.   Specialty: Gastroenterology Contact information: [address] [place] [address] [phone]                Allergies  Allergen Reactions   Codeine Nausea And Vomiting    Consultations: [facility] gastroenterology, Dr. Randel Pigg, Dr. Therisa Doyne   Procedures/Studies:  EGD Findings: A widely patent Schatzki ring was found at the gastroesophageal junction. A 10 cm hiatal hernia was present. Striped moderately erythematous mucosa without bleeding was found in the gastric body, on the greater curvature of the stomach and on the lesser curvature of the stomach. Mucosal changes typical for Patricia Olsen lesions from large hiatal hernia. Few non-bleeding superficial gastric ulcers with a clean ulcer base (Forrest Class III) were found in the gastric antrum. The largest lesion was 3 mm in largest dimension. Biopsies were taken with a cold forceps for Helicobacter pylori testing. The examined duodenum was  normal. Biopsies for histology were taken with a cold forceps for evaluation of celiac disease. Impression: Patient did not receive moderate sedation for this procedure, but instead received monitored anesthesia care. Moderate Sedation: - Resume regular diet. - Continue present medications. - Await pathology results. - Recommend PPI once a day indefinitely and ferrous sulfate 325 mg po daily for 90 days, CBC/iron panel monitoring every 6 weeks with IV iron as outpatient as needed.  Colonoscopy Findings: Skin tags were found on perianal exam. Hemorrhoids were found on perianal exam. The terminal ileum appeared normal. Scattered small and large-mouthed diverticula were found in the sigmoid colon, descending colon, transverse colon and hepatic flexure. A 5 mm polyp was found in the rectum. The polyp was sessile. The polyp was removed with a piecemeal technique using a cold biopsy forceps. Resection and retrieval were  complete. Non-bleeding internal hemorrhoids were found during retroflexion. - Perianal skin tags found on perianal exam. - Hemorrhoids found on perianal exam. - The examined portion of the ileum was normal. - Diverticulosis in the sigmoid colon, in the descending colon, in the transverse colon and at the hepatic flexure. - One 5 mm polyp in the rectum, removed piecemeal using a cold biopsy forceps. Resected and retrieved. - Non-bleeding internal hemorrhoids.  Subjective: Patient seen examined bedside, resting comfortably.  Completed EGD and colonoscopy this morning.  Okay for discharge home per GI with outpatient follow-up in 6 weeks for repeat labs.  No other specific questions or concerns at this time.  Denies headache, no dizziness, no chest pain, no palpitations, no nausea/vomiting/diarrhea, no fever/chills/night sweats, no abdominal pain, no focal weakness, no fatigue, no paresthesias.  No acute events overnight per nursing staff.  Discharge Exam: Vitals:   03/24/22 0900 03/24/22 0920  BP: 114/66 111/67  Pulse: 89 91  Resp: (!) 22 20  Temp:  98.7 F (37.1 C)  SpO2: 95% 99%   Vitals:   03/24/22 0850 03/24/22 0855 03/24/22 0900 03/24/22 0920  BP: 125/67  114/66 111/67  Pulse: 92 88 89 91  Resp: (!) 24 (!) 22 (!) 22 20  Temp:    98.7 F (37.1 C)  TempSrc:    Oral  SpO2: 100% 99% 95% 99%  Weight:      Height:        Physical Exam: GEN: NAD, alert and oriented x 3, wd/wn HEENT: NCAT, PERRL, EOMI, sclera clear, MMM PULM: CTAB w/o wheezes/crackles, normal respiratory effort, on room air CV: RRR w/o M/G/R GI: abd soft, NTND, NABS, no R/G/M MSK: no peripheral edema, muscle strength globally intact 5/5 bilateral upper/lower extremities NEURO: CN II-XII intact, no focal deficits, sensation to light touch intact PSYCH: normal mood/affect Integumentary: dry/intact, no rashes or wounds    The results of significant diagnostics from this hospitalization (including imaging,  microbiology, ancillary and laboratory) are listed below for reference.     Microbiology: Recent Results (from the past 240 hour(s))  MRSA Next Gen by PCR, Nasal     Status: None   Collection Time: 03/20/22  7:24 PM   Specimen: Nasal Mucosa; Nasal Swab  Result Value Ref Range Status   MRSA by PCR Next Gen NOT DETECTED NOT DETECTED Final    Comment: (NOTE) The GeneXpert MRSA Assay (FDA approved for NASAL specimens only), is one component of a comprehensive MRSA colonization surveillance program. It is not intended to diagnose MRSA infection nor to guide or monitor treatment for MRSA infections. Test performance is not FDA approved in patients less than 2  years old. Performed at [facility], [place] [address] [place], [place] [address]      Labs: BNP (last 3 results) No results for input(s): "BNP" in the last 8760 hours. Basic Metabolic Panel: Recent Labs  Lab 03/20/22 1605 03/21/22 0320  NA 134* 137  K 3.7 3.7  CL 106 108  CO2 21* 20*  GLUCOSE 120* 101*  BUN 20 16  CREATININE 0.80 0.78  CALCIUM 8.5* 8.3*  MG  --  2.1   Liver Function Tests: Recent Labs  Lab 03/20/22 1605 03/21/22 0320  AST 17 13*  ALT 12 12  ALKPHOS 54 47  BILITOT 0.6 1.7*  PROT 6.3* 5.7*  ALBUMIN 3.3* 3.1*   Recent Labs  Lab 03/20/22 1605  LIPASE 46   No results for input(s): "AMMONIA" in the last 168 hours. CBC: Recent Labs  Lab 03/20/22 1605 03/21/22 0320 03/21/22 1346 03/21/22 1928 03/22/22 0330 03/22/22 1336 03/23/22 0534 03/24/22 0429  WBC 5.8 5.1  --   --  4.2  --  3.8* 5.0  NEUTROABS 4.1  --   --   --   --   --   --   --   HGB 2.5* 5.4*   < > 8.5* 7.8* 9.0* 8.6* 9.1*  HCT 10.4* 18.5*   < > 28.3* 25.7* 30.5* 29.7* 31.5*  MCV 70.7* 77.4*  --   --  81.1  --  86.3 88.2  PLT 493* 459*  --   --  258  --  223 201   < > = values in this interval not displayed.   Cardiac Enzymes: No results for input(s): "CKTOTAL", "CKMB", "CKMBINDEX", "TROPONINI" in the  last 168 hours. BNP: Invalid input(s): "POCBNP" CBG: No results for input(s): "GLUCAP" in the last 168 hours. D-Dimer No results for input(s): "DDIMER" in the last 72 hours. Hgb A1c No results for input(s): "HGBA1C" in the last 72 hours. Lipid Profile No results for input(s): "CHOL", "HDL", "LDLCALC", "TRIG", "CHOLHDL", "LDLDIRECT" in the last 72 hours. Thyroid function studies Recent Labs    03/22/22 0330  T3FREE 2.0   Anemia work up No results for input(s): "VITAMINB12", "FOLATE", "FERRITIN", "TIBC", "IRON", "RETICCTPCT" in the last 72 hours. Urinalysis No results found for: "COLORURINE", "APPEARANCEUR", "LABSPEC", "PHURINE", "GLUCOSEU", "HGBUR", "BILIRUBINUR", "KETONESUR", "PROTEINUR", "UROBILINOGEN", "NITRITE", "LEUKOCYTESUR" Sepsis Labs Recent Labs  Lab 03/21/22 0320 03/22/22 0330 03/23/22 0534 03/24/22 0429  WBC 5.1 4.2 3.8* 5.0   Microbiology Recent Results (from the past 240 hour(s))  MRSA Next Gen by PCR, Nasal     Status: None   Collection Time: 03/20/22  7:24 PM   Specimen: Nasal Mucosa; Nasal Swab  Result Value Ref Range Status   MRSA by PCR Next Gen NOT DETECTED NOT DETECTED Final    Comment: (NOTE) The GeneXpert MRSA Assay (FDA approved for NASAL specimens only), is one component of a comprehensive MRSA colonization surveillance program. It is not intended to diagnose MRSA infection nor to guide or monitor treatment for MRSA infections. Test performance is not FDA approved in patients less than 58 years old. Performed at [facility], [place] [address] [place], [place] [address]      Time coordinating discharge: Over 30 minutes  SIGNED:   Zaydan Papesh J [place] DO  [facility] 03/24/2022, 11:21 AM

## 2022-03-24 NOTE — Anesthesia Postprocedure Evaluation (Signed)
Anesthesia Post Note  Patient: Ashna Dorough  Procedure(s) Performed: ESOPHAGOGASTRODUODENOSCOPY (EGD) COLONOSCOPY WITH PROPOFOL     Patient location during evaluation: PACU Anesthesia Type: MAC Level of consciousness: awake and alert Pain management: pain level controlled Vital Signs Assessment: post-procedure vital signs reviewed and stable Respiratory status: spontaneous breathing, nonlabored ventilation, respiratory function stable and patient connected to nasal cannula oxygen Cardiovascular status: stable and blood pressure returned to baseline Postop Assessment: no apparent nausea or vomiting Anesthetic complications: no   No notable events documented.  Last Vitals:  Vitals:   03/24/22 0855 03/24/22 0900  BP:  114/66  Pulse: 88 89  Resp: (!) 22 (!) 22  Temp:    SpO2: 99% 95%    Last Pain:  Vitals:   03/24/22 0900  TempSrc:   PainSc: 0-No pain                 Tiajuana Amass

## 2022-03-24 NOTE — Anesthesia Procedure Notes (Signed)
Procedure Name: MAC Date/Time: 03/24/2022 7:57 AM  Performed by: Eben Burow, CRNAPre-anesthesia Checklist: Patient identified, Emergency Drugs available, Suction available, Patient being monitored and Timeout performed Oxygen Delivery Method: Simple face mask Placement Confirmation: positive ETCO2

## 2022-03-24 NOTE — Op Note (Signed)
[facility] Patient Name: Patricia Olsen Procedure Date: 03/24/2022 MRN: [id number] Attending MD: Ronnette Juniper , MD Date of Birth: 12-14-1960 CSN: [id number] Age: 61 Admit Type: Inpatient Procedure:                Upper GI endoscopy Indications:              Unexplained iron deficiency anemia, ?melena Providers:                Ronnette Juniper, MD, Dulcy Fanny, William Dalton,                            Technician Referring MD:             [facility] Medicines:                Monitored Anesthesia Care Complications:            No immediate complications. Estimated blood loss:                            Minimal. Estimated Blood Loss:     Estimated blood loss was minimal. Procedure:                Pre-Anesthesia Assessment:                           - Prior to the procedure, a History and Physical                            was performed, and patient medications and                            allergies were reviewed. The patient's tolerance of                            previous anesthesia was also reviewed. The risks                            and benefits of the procedure and the sedation                            options and risks were discussed with the patient.                            All questions were answered, and informed consent                            was obtained. Prior Anticoagulants: The patient has                            taken no previous anticoagulant or antiplatelet                            agents. ASA Grade Assessment: III - A patient with  severe systemic disease. After reviewing the risks                            and benefits, the patient was deemed in                            satisfactory condition to undergo the procedure.                           After obtaining informed consent, the endoscope was                            passed under direct vision. Throughout the                             procedure, the patient's blood pressure, pulse, and                            oxygen saturations were monitored continuously. The                            [id number]-H190 ([id number]) Olympus endoscope was introduced                            through the mouth, and advanced to the second part                            of duodenum. The upper GI endoscopy was                            accomplished without difficulty. The patient                            tolerated the procedure well. Scope In: Scope Out: Findings:      The examined esophagus was normal.      A widely patent Schatzki ring was found at the gastroesophageal junction.      A 10 cm hiatal hernia was present.      Striped moderately erythematous mucosa without bleeding was found in the       gastric body, on the greater curvature of the stomach and on the lesser       curvature of the stomach. Mucosal changes typical for Lysbeth Galas lesions       from large hiatal hernia.      Few non-bleeding superficial gastric ulcers with a clean ulcer base       (Forrest Class III) were found in the gastric antrum. The largest lesion       was 3 mm in largest dimension. Biopsies were taken with a cold forceps       for Helicobacter pylori testing.      The examined duodenum was normal. Biopsies for histology were taken with       a cold forceps for evaluation of celiac disease. Impression:               - Normal esophagus.                           -  Widely patent Schatzki ring.                           - 10 cm hiatal hernia.                           - Erythematous mucosa in the gastric body, greater                            curvature and lesser curvature.                           - Non-bleeding gastric ulcers with a clean ulcer                            base (Forrest Class III). Biopsied.                           - Normal examined duodenum. Biopsied. Moderate Sedation:      Patient did not receive moderate sedation for this procedure,  but       instead received monitored anesthesia care. Recommendation:           - Resume regular diet.                           - Continue present medications.                           - Await pathology results.                           - Recommend PPI once a day indefinitely and ferrous                            sulfate 325 mg po daily for 90 days, CBC/iron panel                            monitoring every 6 weeks with IV iron as outpatient                            as needed.                           - Perform a colonoscopy today. Procedure Code(s):        --- Professional ---                           [phone], Esophagogastroduodenoscopy, flexible,                            transoral; with biopsy, single or multiple Diagnosis Code(s):        --- Professional ---                           K22.2, Esophageal obstruction  K44.9, Diaphragmatic hernia without obstruction or                            gangrene                           K31.89, Other diseases of stomach and duodenum                           K25.9, Gastric ulcer, unspecified as acute or                            chronic, without hemorrhage or perforation                           D50.9, Iron deficiency anemia, unspecified CPT copyright 2019 [employer]. All rights reserved. The codes documented in this report are preliminary and upon coder review may  be revised to meet current compliance requirements. Ronnette Juniper, MD 03/24/2022 8:34:16 AM This report has been signed electronically. Number of Addenda: 0

## 2022-03-24 NOTE — Anesthesia Preprocedure Evaluation (Signed)
Anesthesia Evaluation  Patient identified by MRN, date of birth, ID band Patient awake    Reviewed: Allergy & Precautions, NPO status , Patient's Chart, lab work & pertinent test results  Airway Mallampati: II  TM Distance: >3 FB Neck ROM: Full    Dental  (+) Dental Advisory Given   Pulmonary neg pulmonary ROS,    breath sounds clear to auscultation       Cardiovascular negative cardio ROS   Rhythm:Regular Rate:Normal     Neuro/Psych negative neurological ROS     GI/Hepatic negative GI ROS, Neg liver ROS,   Endo/Other  Hypothyroidism   Renal/GU negative Renal ROS     Musculoskeletal   Abdominal   Peds  Hematology  (+) Blood dyscrasia, anemia ,   Anesthesia Other Findings   Reproductive/Obstetrics                             Anesthesia Physical Anesthesia Plan  ASA: 3  Anesthesia Plan: MAC   Post-op Pain Management: Minimal or no pain anticipated   Induction:   PONV Risk Score and Plan: 2 and Propofol infusion and Ondansetron  Airway Management Planned: Natural Airway and Nasal Cannula  Additional Equipment:   Intra-op Plan:   Post-operative Plan:   Informed Consent: I have reviewed the patients History and Physical, chart, labs and discussed the procedure including the risks, benefits and alternatives for the proposed anesthesia with the patient or authorized representative who has indicated his/her understanding and acceptance.       Plan Discussed with:   Anesthesia Plan Comments:         Anesthesia Quick Evaluation

## 2022-03-24 NOTE — Interval H&P Note (Signed)
History and Physical Interval Note: 61/female with symptomatic, severe, microcytic, iron deficiency anemia for an EGD and colonoscopy with propofol.  03/24/2022 7:41 AM  Patricia Olsen  has presented today for EGD and colonoscopy, with the diagnosis of Iron deficiency anemia.  The various methods of treatment have been discussed with the patient and family. After consideration of risks, benefits and other options for treatment, the patient has consented to  Procedure(s): ESOPHAGOGASTRODUODENOSCOPY (EGD) (N/A) COLONOSCOPY WITH PROPOFOL (N/A) as a surgical intervention.  The patient's history has been reviewed, patient examined, no change in status, stable for surgery.  I have reviewed the patient's chart and labs.  Questions were answered to the patient's satisfaction.     Ronnette Juniper

## 2022-03-25 LAB — SURGICAL PATHOLOGY

## 2022-03-26 ENCOUNTER — Encounter (HOSPITAL_COMMUNITY): Payer: Self-pay | Admitting: Gastroenterology
